# Patient Record
Sex: Female | Born: 1960 | Race: White | Hispanic: No | Marital: Married | Smoking: Former smoker
Health system: Southern US, Community
[De-identification: ages and names within clinical notes are randomized; demographics above are authoritative.]

## PROBLEM LIST (undated history)

## (undated) DIAGNOSIS — E7800 Pure hypercholesterolemia, unspecified: Secondary | ICD-10-CM

## (undated) DIAGNOSIS — C50919 Malignant neoplasm of unspecified site of unspecified female breast: Secondary | ICD-10-CM

## (undated) DIAGNOSIS — C801 Malignant (primary) neoplasm, unspecified: Secondary | ICD-10-CM

## (undated) DIAGNOSIS — F411 Generalized anxiety disorder: Secondary | ICD-10-CM

## (undated) DIAGNOSIS — E782 Mixed hyperlipidemia: Secondary | ICD-10-CM

## (undated) DIAGNOSIS — H4921 Sixth [abducent] nerve palsy, right eye: Secondary | ICD-10-CM

## (undated) DIAGNOSIS — R87613 High grade squamous intraepithelial lesion on cytologic smear of cervix (HGSIL): Secondary | ICD-10-CM

## (undated) DIAGNOSIS — Z720 Tobacco use: Secondary | ICD-10-CM

## (undated) DIAGNOSIS — T8859XA Other complications of anesthesia, initial encounter: Secondary | ICD-10-CM

## (undated) HISTORY — PX: WISDOM TOOTH EXTRACTION: SHX21

## (undated) HISTORY — DX: Mixed hyperlipidemia: E78.2

## (undated) HISTORY — DX: Hypercalcemia: E83.52

## (undated) HISTORY — DX: Tobacco use: Z72.0

## (undated) HISTORY — PX: OTHER SURGICAL HISTORY: SHX169

## (undated) HISTORY — PX: TONSILLECTOMY: SUR1361

## (undated) HISTORY — PX: BREAST SURGERY: SHX581

## (undated) HISTORY — DX: Sixth (abducent) nerve palsy, right eye: H49.21

## (undated) HISTORY — DX: Generalized anxiety disorder: F41.1

---

## 2012-10-05 HISTORY — PX: BREAST LUMPECTOMY: SHX2

## 2016-07-20 DIAGNOSIS — M545 Low back pain: Secondary | ICD-10-CM | POA: Diagnosis not present

## 2016-07-20 DIAGNOSIS — J018 Other acute sinusitis: Secondary | ICD-10-CM | POA: Diagnosis not present

## 2016-08-20 DIAGNOSIS — H35363 Drusen (degenerative) of macula, bilateral: Secondary | ICD-10-CM | POA: Diagnosis not present

## 2016-10-05 HISTORY — PX: BREAST SURGERY: SHX581

## 2016-10-05 HISTORY — PX: BREAST LUMPECTOMY: SHX2

## 2016-11-03 DIAGNOSIS — J0100 Acute maxillary sinusitis, unspecified: Secondary | ICD-10-CM | POA: Diagnosis not present

## 2016-11-03 DIAGNOSIS — H6122 Impacted cerumen, left ear: Secondary | ICD-10-CM | POA: Diagnosis not present

## 2016-11-10 DIAGNOSIS — H6122 Impacted cerumen, left ear: Secondary | ICD-10-CM | POA: Diagnosis not present

## 2016-11-30 DIAGNOSIS — E782 Mixed hyperlipidemia: Secondary | ICD-10-CM | POA: Diagnosis not present

## 2016-11-30 DIAGNOSIS — Z0000 Encounter for general adult medical examination without abnormal findings: Secondary | ICD-10-CM | POA: Diagnosis not present

## 2016-12-10 DIAGNOSIS — Z6834 Body mass index (BMI) 34.0-34.9, adult: Secondary | ICD-10-CM | POA: Diagnosis not present

## 2016-12-10 DIAGNOSIS — Z0000 Encounter for general adult medical examination without abnormal findings: Secondary | ICD-10-CM | POA: Diagnosis not present

## 2016-12-16 DIAGNOSIS — R928 Other abnormal and inconclusive findings on diagnostic imaging of breast: Secondary | ICD-10-CM | POA: Diagnosis not present

## 2016-12-16 DIAGNOSIS — N6342 Unspecified lump in left breast, subareolar: Secondary | ICD-10-CM | POA: Diagnosis not present

## 2016-12-16 DIAGNOSIS — N6489 Other specified disorders of breast: Secondary | ICD-10-CM | POA: Diagnosis not present

## 2016-12-16 DIAGNOSIS — N6001 Solitary cyst of right breast: Secondary | ICD-10-CM | POA: Diagnosis not present

## 2016-12-17 DIAGNOSIS — N63 Unspecified lump in unspecified breast: Secondary | ICD-10-CM | POA: Diagnosis not present

## 2016-12-17 DIAGNOSIS — N6320 Unspecified lump in the left breast, unspecified quadrant: Secondary | ICD-10-CM | POA: Diagnosis not present

## 2016-12-17 DIAGNOSIS — C50812 Malignant neoplasm of overlapping sites of left female breast: Secondary | ICD-10-CM | POA: Diagnosis not present

## 2016-12-24 ENCOUNTER — Other Ambulatory Visit: Payer: Self-pay | Admitting: Medical

## 2016-12-24 DIAGNOSIS — C50912 Malignant neoplasm of unspecified site of left female breast: Secondary | ICD-10-CM

## 2016-12-28 ENCOUNTER — Ambulatory Visit
Admission: RE | Admit: 2016-12-28 | Discharge: 2016-12-28 | Disposition: A | Discharge status: Home / Self Care | Payer: BLUE CROSS/BLUE SHIELD | Source: Ambulatory Visit | Attending: Medical | Admitting: Medical

## 2016-12-28 DIAGNOSIS — C50912 Malignant neoplasm of unspecified site of left female breast: Secondary | ICD-10-CM

## 2016-12-28 MED ORDER — GADOBENATE DIMEGLUMINE 529 MG/ML IV SOLN
17.0000 mL | Freq: Once | INTRAVENOUS | Status: AC | PRN
  Administered 2016-12-28: 17 mL via INTRAVENOUS

## 2017-01-07 DIAGNOSIS — C50912 Malignant neoplasm of unspecified site of left female breast: Secondary | ICD-10-CM | POA: Diagnosis not present

## 2017-01-07 DIAGNOSIS — Z170 Estrogen receptor positive status [ER+]: Secondary | ICD-10-CM | POA: Diagnosis not present

## 2017-01-25 DIAGNOSIS — Z170 Estrogen receptor positive status [ER+]: Secondary | ICD-10-CM | POA: Diagnosis not present

## 2017-01-25 DIAGNOSIS — C50412 Malignant neoplasm of upper-outer quadrant of left female breast: Secondary | ICD-10-CM | POA: Diagnosis not present

## 2017-01-25 DIAGNOSIS — Z6833 Body mass index (BMI) 33.0-33.9, adult: Secondary | ICD-10-CM | POA: Diagnosis not present

## 2017-01-25 DIAGNOSIS — C50912 Malignant neoplasm of unspecified site of left female breast: Secondary | ICD-10-CM | POA: Diagnosis not present

## 2017-01-25 DIAGNOSIS — F17210 Nicotine dependence, cigarettes, uncomplicated: Secondary | ICD-10-CM | POA: Diagnosis not present

## 2017-01-25 DIAGNOSIS — E7800 Pure hypercholesterolemia, unspecified: Secondary | ICD-10-CM | POA: Diagnosis not present

## 2017-01-25 DIAGNOSIS — E669 Obesity, unspecified: Secondary | ICD-10-CM | POA: Diagnosis not present

## 2017-01-25 DIAGNOSIS — Z79899 Other long term (current) drug therapy: Secondary | ICD-10-CM | POA: Diagnosis not present

## 2017-02-04 DIAGNOSIS — C50912 Malignant neoplasm of unspecified site of left female breast: Secondary | ICD-10-CM | POA: Diagnosis not present

## 2017-02-16 DIAGNOSIS — Z9889 Other specified postprocedural states: Secondary | ICD-10-CM | POA: Diagnosis not present

## 2017-02-24 DIAGNOSIS — C50812 Malignant neoplasm of overlapping sites of left female breast: Secondary | ICD-10-CM | POA: Diagnosis not present

## 2017-02-24 DIAGNOSIS — Z716 Tobacco abuse counseling: Secondary | ICD-10-CM | POA: Diagnosis not present

## 2017-02-24 DIAGNOSIS — Z170 Estrogen receptor positive status [ER+]: Secondary | ICD-10-CM | POA: Diagnosis not present

## 2017-03-05 DIAGNOSIS — Z853 Personal history of malignant neoplasm of breast: Secondary | ICD-10-CM | POA: Diagnosis not present

## 2017-03-05 DIAGNOSIS — Z13820 Encounter for screening for osteoporosis: Secondary | ICD-10-CM | POA: Diagnosis not present

## 2017-03-05 DIAGNOSIS — M8589 Other specified disorders of bone density and structure, multiple sites: Secondary | ICD-10-CM | POA: Diagnosis not present

## 2017-03-11 DIAGNOSIS — C50112 Malignant neoplasm of central portion of left female breast: Secondary | ICD-10-CM | POA: Diagnosis not present

## 2017-03-11 DIAGNOSIS — C50912 Malignant neoplasm of unspecified site of left female breast: Secondary | ICD-10-CM | POA: Diagnosis not present

## 2017-03-17 DIAGNOSIS — Z5111 Encounter for antineoplastic chemotherapy: Secondary | ICD-10-CM | POA: Diagnosis not present

## 2017-03-17 DIAGNOSIS — C50112 Malignant neoplasm of central portion of left female breast: Secondary | ICD-10-CM | POA: Diagnosis not present

## 2017-03-24 DIAGNOSIS — Z510 Encounter for antineoplastic radiation therapy: Secondary | ICD-10-CM | POA: Diagnosis not present

## 2017-03-24 DIAGNOSIS — C50112 Malignant neoplasm of central portion of left female breast: Secondary | ICD-10-CM | POA: Diagnosis not present

## 2017-03-26 DIAGNOSIS — Z510 Encounter for antineoplastic radiation therapy: Secondary | ICD-10-CM | POA: Diagnosis not present

## 2017-03-26 DIAGNOSIS — C50112 Malignant neoplasm of central portion of left female breast: Secondary | ICD-10-CM | POA: Diagnosis not present

## 2017-03-29 DIAGNOSIS — C50112 Malignant neoplasm of central portion of left female breast: Secondary | ICD-10-CM | POA: Diagnosis not present

## 2017-03-29 DIAGNOSIS — Z510 Encounter for antineoplastic radiation therapy: Secondary | ICD-10-CM | POA: Diagnosis not present

## 2017-03-30 DIAGNOSIS — C50112 Malignant neoplasm of central portion of left female breast: Secondary | ICD-10-CM | POA: Diagnosis not present

## 2017-03-30 DIAGNOSIS — Z510 Encounter for antineoplastic radiation therapy: Secondary | ICD-10-CM | POA: Diagnosis not present

## 2017-03-31 DIAGNOSIS — Z510 Encounter for antineoplastic radiation therapy: Secondary | ICD-10-CM | POA: Diagnosis not present

## 2017-03-31 DIAGNOSIS — C50112 Malignant neoplasm of central portion of left female breast: Secondary | ICD-10-CM | POA: Diagnosis not present

## 2017-04-01 DIAGNOSIS — C50112 Malignant neoplasm of central portion of left female breast: Secondary | ICD-10-CM | POA: Diagnosis not present

## 2017-04-01 DIAGNOSIS — Z510 Encounter for antineoplastic radiation therapy: Secondary | ICD-10-CM | POA: Diagnosis not present

## 2017-04-02 DIAGNOSIS — Z510 Encounter for antineoplastic radiation therapy: Secondary | ICD-10-CM | POA: Diagnosis not present

## 2017-04-02 DIAGNOSIS — C50112 Malignant neoplasm of central portion of left female breast: Secondary | ICD-10-CM | POA: Diagnosis not present

## 2017-04-05 DIAGNOSIS — Z510 Encounter for antineoplastic radiation therapy: Secondary | ICD-10-CM | POA: Diagnosis not present

## 2017-04-05 DIAGNOSIS — C50112 Malignant neoplasm of central portion of left female breast: Secondary | ICD-10-CM | POA: Diagnosis not present

## 2017-04-06 DIAGNOSIS — Z510 Encounter for antineoplastic radiation therapy: Secondary | ICD-10-CM | POA: Diagnosis not present

## 2017-04-06 DIAGNOSIS — C50112 Malignant neoplasm of central portion of left female breast: Secondary | ICD-10-CM | POA: Diagnosis not present

## 2017-04-08 DIAGNOSIS — Z510 Encounter for antineoplastic radiation therapy: Secondary | ICD-10-CM | POA: Diagnosis not present

## 2017-04-08 DIAGNOSIS — C50112 Malignant neoplasm of central portion of left female breast: Secondary | ICD-10-CM | POA: Diagnosis not present

## 2017-04-09 DIAGNOSIS — Z510 Encounter for antineoplastic radiation therapy: Secondary | ICD-10-CM | POA: Diagnosis not present

## 2017-04-09 DIAGNOSIS — C50112 Malignant neoplasm of central portion of left female breast: Secondary | ICD-10-CM | POA: Diagnosis not present

## 2017-04-12 DIAGNOSIS — C50112 Malignant neoplasm of central portion of left female breast: Secondary | ICD-10-CM | POA: Diagnosis not present

## 2017-04-12 DIAGNOSIS — Z510 Encounter for antineoplastic radiation therapy: Secondary | ICD-10-CM | POA: Diagnosis not present

## 2017-04-13 DIAGNOSIS — C50112 Malignant neoplasm of central portion of left female breast: Secondary | ICD-10-CM | POA: Diagnosis not present

## 2017-04-13 DIAGNOSIS — Z510 Encounter for antineoplastic radiation therapy: Secondary | ICD-10-CM | POA: Diagnosis not present

## 2017-04-14 DIAGNOSIS — Z510 Encounter for antineoplastic radiation therapy: Secondary | ICD-10-CM | POA: Diagnosis not present

## 2017-04-14 DIAGNOSIS — C50112 Malignant neoplasm of central portion of left female breast: Secondary | ICD-10-CM | POA: Diagnosis not present

## 2017-04-15 DIAGNOSIS — C50112 Malignant neoplasm of central portion of left female breast: Secondary | ICD-10-CM | POA: Diagnosis not present

## 2017-04-15 DIAGNOSIS — Z510 Encounter for antineoplastic radiation therapy: Secondary | ICD-10-CM | POA: Diagnosis not present

## 2017-04-16 DIAGNOSIS — C50112 Malignant neoplasm of central portion of left female breast: Secondary | ICD-10-CM | POA: Diagnosis not present

## 2017-04-16 DIAGNOSIS — Z510 Encounter for antineoplastic radiation therapy: Secondary | ICD-10-CM | POA: Diagnosis not present

## 2017-04-19 DIAGNOSIS — C50112 Malignant neoplasm of central portion of left female breast: Secondary | ICD-10-CM | POA: Diagnosis not present

## 2017-04-19 DIAGNOSIS — Z510 Encounter for antineoplastic radiation therapy: Secondary | ICD-10-CM | POA: Diagnosis not present

## 2017-04-20 DIAGNOSIS — Z510 Encounter for antineoplastic radiation therapy: Secondary | ICD-10-CM | POA: Diagnosis not present

## 2017-04-20 DIAGNOSIS — C50112 Malignant neoplasm of central portion of left female breast: Secondary | ICD-10-CM | POA: Diagnosis not present

## 2017-04-21 DIAGNOSIS — Z510 Encounter for antineoplastic radiation therapy: Secondary | ICD-10-CM | POA: Diagnosis not present

## 2017-04-21 DIAGNOSIS — C50112 Malignant neoplasm of central portion of left female breast: Secondary | ICD-10-CM | POA: Diagnosis not present

## 2017-04-22 DIAGNOSIS — Z510 Encounter for antineoplastic radiation therapy: Secondary | ICD-10-CM | POA: Diagnosis not present

## 2017-04-22 DIAGNOSIS — C50112 Malignant neoplasm of central portion of left female breast: Secondary | ICD-10-CM | POA: Diagnosis not present

## 2017-04-23 DIAGNOSIS — Z510 Encounter for antineoplastic radiation therapy: Secondary | ICD-10-CM | POA: Diagnosis not present

## 2017-04-23 DIAGNOSIS — C50112 Malignant neoplasm of central portion of left female breast: Secondary | ICD-10-CM | POA: Diagnosis not present

## 2017-04-26 DIAGNOSIS — C50112 Malignant neoplasm of central portion of left female breast: Secondary | ICD-10-CM | POA: Diagnosis not present

## 2017-04-26 DIAGNOSIS — Z510 Encounter for antineoplastic radiation therapy: Secondary | ICD-10-CM | POA: Diagnosis not present

## 2017-04-27 DIAGNOSIS — Z510 Encounter for antineoplastic radiation therapy: Secondary | ICD-10-CM | POA: Diagnosis not present

## 2017-04-27 DIAGNOSIS — C50112 Malignant neoplasm of central portion of left female breast: Secondary | ICD-10-CM | POA: Diagnosis not present

## 2017-04-28 DIAGNOSIS — C50112 Malignant neoplasm of central portion of left female breast: Secondary | ICD-10-CM | POA: Diagnosis not present

## 2017-04-28 DIAGNOSIS — Z510 Encounter for antineoplastic radiation therapy: Secondary | ICD-10-CM | POA: Diagnosis not present

## 2017-04-29 DIAGNOSIS — C50112 Malignant neoplasm of central portion of left female breast: Secondary | ICD-10-CM | POA: Diagnosis not present

## 2017-04-29 DIAGNOSIS — Z510 Encounter for antineoplastic radiation therapy: Secondary | ICD-10-CM | POA: Diagnosis not present

## 2017-04-30 DIAGNOSIS — C50112 Malignant neoplasm of central portion of left female breast: Secondary | ICD-10-CM | POA: Diagnosis not present

## 2017-04-30 DIAGNOSIS — Z510 Encounter for antineoplastic radiation therapy: Secondary | ICD-10-CM | POA: Diagnosis not present

## 2017-05-03 DIAGNOSIS — C50112 Malignant neoplasm of central portion of left female breast: Secondary | ICD-10-CM | POA: Diagnosis not present

## 2017-05-03 DIAGNOSIS — Z510 Encounter for antineoplastic radiation therapy: Secondary | ICD-10-CM | POA: Diagnosis not present

## 2017-05-04 DIAGNOSIS — Z510 Encounter for antineoplastic radiation therapy: Secondary | ICD-10-CM | POA: Diagnosis not present

## 2017-05-04 DIAGNOSIS — C50112 Malignant neoplasm of central portion of left female breast: Secondary | ICD-10-CM | POA: Diagnosis not present

## 2017-05-05 DIAGNOSIS — C50112 Malignant neoplasm of central portion of left female breast: Secondary | ICD-10-CM | POA: Diagnosis not present

## 2017-05-05 DIAGNOSIS — Z510 Encounter for antineoplastic radiation therapy: Secondary | ICD-10-CM | POA: Diagnosis not present

## 2017-05-06 DIAGNOSIS — Z510 Encounter for antineoplastic radiation therapy: Secondary | ICD-10-CM | POA: Diagnosis not present

## 2017-05-06 DIAGNOSIS — C50112 Malignant neoplasm of central portion of left female breast: Secondary | ICD-10-CM | POA: Diagnosis not present

## 2017-05-07 DIAGNOSIS — C50112 Malignant neoplasm of central portion of left female breast: Secondary | ICD-10-CM | POA: Diagnosis not present

## 2017-05-07 DIAGNOSIS — Z510 Encounter for antineoplastic radiation therapy: Secondary | ICD-10-CM | POA: Diagnosis not present

## 2017-05-10 DIAGNOSIS — C50112 Malignant neoplasm of central portion of left female breast: Secondary | ICD-10-CM | POA: Diagnosis not present

## 2017-05-10 DIAGNOSIS — Z510 Encounter for antineoplastic radiation therapy: Secondary | ICD-10-CM | POA: Diagnosis not present

## 2017-06-02 DIAGNOSIS — E782 Mixed hyperlipidemia: Secondary | ICD-10-CM | POA: Diagnosis not present

## 2017-06-04 DIAGNOSIS — Z923 Personal history of irradiation: Secondary | ICD-10-CM | POA: Diagnosis not present

## 2017-06-04 DIAGNOSIS — Z79811 Long term (current) use of aromatase inhibitors: Secondary | ICD-10-CM | POA: Diagnosis not present

## 2017-06-04 DIAGNOSIS — C50112 Malignant neoplasm of central portion of left female breast: Secondary | ICD-10-CM | POA: Diagnosis not present

## 2017-06-04 DIAGNOSIS — C50912 Malignant neoplasm of unspecified site of left female breast: Secondary | ICD-10-CM | POA: Diagnosis not present

## 2017-06-10 DIAGNOSIS — C50112 Malignant neoplasm of central portion of left female breast: Secondary | ICD-10-CM | POA: Diagnosis not present

## 2017-09-03 DIAGNOSIS — Z79811 Long term (current) use of aromatase inhibitors: Secondary | ICD-10-CM | POA: Diagnosis not present

## 2017-09-03 DIAGNOSIS — Z853 Personal history of malignant neoplasm of breast: Secondary | ICD-10-CM | POA: Diagnosis not present

## 2017-10-05 DIAGNOSIS — H4921 Sixth [abducent] nerve palsy, right eye: Secondary | ICD-10-CM

## 2017-10-05 DIAGNOSIS — I639 Cerebral infarction, unspecified: Secondary | ICD-10-CM

## 2017-10-05 HISTORY — DX: Sixth (abducent) nerve palsy, right eye: H49.21

## 2017-10-05 HISTORY — DX: Cerebral infarction, unspecified: I63.9

## 2017-11-05 HISTORY — PX: CERVICAL CONE BIOPSY: SUR198

## 2017-12-03 DIAGNOSIS — Z86000 Personal history of in-situ neoplasm of breast: Secondary | ICD-10-CM | POA: Diagnosis not present

## 2017-12-03 DIAGNOSIS — E782 Mixed hyperlipidemia: Secondary | ICD-10-CM | POA: Diagnosis not present

## 2017-12-20 DIAGNOSIS — R922 Inconclusive mammogram: Secondary | ICD-10-CM | POA: Diagnosis not present

## 2017-12-20 DIAGNOSIS — C50112 Malignant neoplasm of central portion of left female breast: Secondary | ICD-10-CM | POA: Diagnosis not present

## 2017-12-20 DIAGNOSIS — N6489 Other specified disorders of breast: Secondary | ICD-10-CM | POA: Diagnosis not present

## 2017-12-23 DIAGNOSIS — C50912 Malignant neoplasm of unspecified site of left female breast: Secondary | ICD-10-CM | POA: Diagnosis not present

## 2017-12-23 DIAGNOSIS — Z853 Personal history of malignant neoplasm of breast: Secondary | ICD-10-CM | POA: Diagnosis not present

## 2017-12-23 DIAGNOSIS — Z923 Personal history of irradiation: Secondary | ICD-10-CM | POA: Diagnosis not present

## 2017-12-23 DIAGNOSIS — Z79811 Long term (current) use of aromatase inhibitors: Secondary | ICD-10-CM | POA: Diagnosis not present

## 2018-03-24 DIAGNOSIS — Z853 Personal history of malignant neoplasm of breast: Secondary | ICD-10-CM | POA: Diagnosis not present

## 2018-03-24 DIAGNOSIS — F17210 Nicotine dependence, cigarettes, uncomplicated: Secondary | ICD-10-CM | POA: Diagnosis not present

## 2018-03-29 DIAGNOSIS — R918 Other nonspecific abnormal finding of lung field: Secondary | ICD-10-CM | POA: Diagnosis not present

## 2018-03-29 DIAGNOSIS — I700 Atherosclerosis of aorta: Secondary | ICD-10-CM | POA: Diagnosis not present

## 2018-03-29 DIAGNOSIS — F17210 Nicotine dependence, cigarettes, uncomplicated: Secondary | ICD-10-CM | POA: Diagnosis not present

## 2018-03-29 DIAGNOSIS — Z87891 Personal history of nicotine dependence: Secondary | ICD-10-CM | POA: Diagnosis not present

## 2018-06-07 DIAGNOSIS — E782 Mixed hyperlipidemia: Secondary | ICD-10-CM | POA: Diagnosis not present

## 2018-06-07 DIAGNOSIS — Z86000 Personal history of in-situ neoplasm of breast: Secondary | ICD-10-CM | POA: Diagnosis not present

## 2018-06-24 DIAGNOSIS — L538 Other specified erythematous conditions: Secondary | ICD-10-CM

## 2018-06-24 DIAGNOSIS — Z923 Personal history of irradiation: Secondary | ICD-10-CM

## 2018-06-24 DIAGNOSIS — Z853 Personal history of malignant neoplasm of breast: Secondary | ICD-10-CM | POA: Diagnosis not present

## 2018-06-24 DIAGNOSIS — C50912 Malignant neoplasm of unspecified site of left female breast: Secondary | ICD-10-CM

## 2018-06-24 DIAGNOSIS — Z79811 Long term (current) use of aromatase inhibitors: Secondary | ICD-10-CM

## 2018-09-21 DIAGNOSIS — Z853 Personal history of malignant neoplasm of breast: Secondary | ICD-10-CM | POA: Diagnosis not present

## 2018-09-21 DIAGNOSIS — Z79811 Long term (current) use of aromatase inhibitors: Secondary | ICD-10-CM | POA: Diagnosis not present

## 2018-09-27 DIAGNOSIS — G459 Transient cerebral ischemic attack, unspecified: Secondary | ICD-10-CM | POA: Insufficient documentation

## 2018-09-29 ENCOUNTER — Emergency Department (HOSPITAL_COMMUNITY): Payer: BLUE CROSS/BLUE SHIELD

## 2018-09-29 ENCOUNTER — Emergency Department (HOSPITAL_COMMUNITY)
Admission: EM | Admit: 2018-09-29 | Discharge: 2018-09-29 | Disposition: A | Discharge status: Home / Self Care | Payer: BLUE CROSS/BLUE SHIELD | Attending: Emergency Medicine | Admitting: Emergency Medicine

## 2018-09-29 ENCOUNTER — Other Ambulatory Visit: Payer: Self-pay

## 2018-09-29 ENCOUNTER — Encounter (HOSPITAL_COMMUNITY): Payer: Self-pay | Admitting: *Deleted

## 2018-09-29 DIAGNOSIS — Z79899 Other long term (current) drug therapy: Secondary | ICD-10-CM | POA: Diagnosis not present

## 2018-09-29 DIAGNOSIS — R9082 White matter disease, unspecified: Secondary | ICD-10-CM | POA: Diagnosis not present

## 2018-09-29 DIAGNOSIS — H532 Diplopia: Secondary | ICD-10-CM | POA: Diagnosis not present

## 2018-09-29 DIAGNOSIS — H53481 Generalized contraction of visual field, right eye: Secondary | ICD-10-CM | POA: Diagnosis not present

## 2018-09-29 DIAGNOSIS — R51 Headache: Secondary | ICD-10-CM | POA: Diagnosis not present

## 2018-09-29 DIAGNOSIS — H4921 Sixth [abducent] nerve palsy, right eye: Secondary | ICD-10-CM | POA: Insufficient documentation

## 2018-09-29 DIAGNOSIS — R001 Bradycardia, unspecified: Secondary | ICD-10-CM | POA: Diagnosis not present

## 2018-09-29 DIAGNOSIS — F17200 Nicotine dependence, unspecified, uncomplicated: Secondary | ICD-10-CM | POA: Diagnosis not present

## 2018-09-29 HISTORY — DX: Malignant (primary) neoplasm, unspecified: C80.1

## 2018-09-29 HISTORY — DX: Pure hypercholesterolemia, unspecified: E78.00

## 2018-09-29 LAB — CBG MONITORING, ED: Glucose-Capillary: 76 mg/dL (ref 70–99)

## 2018-09-29 LAB — APTT: aPTT: 29 seconds (ref 24–36)

## 2018-09-29 LAB — COMPREHENSIVE METABOLIC PANEL
ALK PHOS: 92 U/L (ref 38–126)
ALT: 22 U/L (ref 0–44)
ANION GAP: 8 (ref 5–15)
AST: 23 U/L (ref 15–41)
Albumin: 4.2 g/dL (ref 3.5–5.0)
BUN: 15 mg/dL (ref 6–20)
CALCIUM: 10.3 mg/dL (ref 8.9–10.3)
CO2: 23 mmol/L (ref 22–32)
Chloride: 108 mmol/L (ref 98–111)
Creatinine, Ser: 1.02 mg/dL — ABNORMAL HIGH (ref 0.44–1.00)
GFR calc Af Amer: >60 mL/min (ref >60)
GFR calc non Af Amer: >60 mL/min (ref >60)
Glucose, Bld: 92 mg/dL (ref 70–99)
POTASSIUM: 4.3 mmol/L (ref 3.5–5.1)
Sodium: 139 mmol/L (ref 135–145)
TOTAL PROTEIN: 6.9 g/dL (ref 6.5–8.1)
Total Bilirubin: 0.6 mg/dL (ref 0.3–1.2)

## 2018-09-29 LAB — I-STAT CHEM 8, ED
BUN: 17 mg/dL (ref 6–20)
Calcium, Ion: 1.32 mmol/L (ref 1.15–1.40)
Chloride: 108 mmol/L (ref 98–111)
Creatinine, Ser: 1 mg/dL (ref 0.44–1.00)
Glucose, Bld: 88 mg/dL (ref 70–99)
HCT: 45 % (ref 36.0–46.0)
Hemoglobin: 15.3 g/dL — ABNORMAL HIGH (ref 12.0–15.0)
Potassium: 3.9 mmol/L (ref 3.5–5.1)
Sodium: 141 mmol/L (ref 135–145)
TCO2: 27 mmol/L (ref 22–32)

## 2018-09-29 LAB — I-STAT TROPONIN, ED: Troponin i, poc: 0 ng/mL (ref 0.00–0.08)

## 2018-09-29 LAB — DIFFERENTIAL
Abs Immature Granulocytes: 0.01 10*3/uL (ref 0.00–0.07)
Basophils Absolute: 0.1 10*3/uL (ref 0.0–0.1)
Basophils Relative: 1 %
Eosinophils Absolute: 0.1 10*3/uL (ref 0.0–0.5)
Eosinophils Relative: 1 %
Immature Granulocytes: 0 %
Lymphocytes Relative: 26 %
Lymphs Abs: 2.4 10*3/uL (ref 0.7–4.0)
Monocytes Absolute: 0.5 10*3/uL (ref 0.1–1.0)
Monocytes Relative: 6 %
Neutro Abs: 6.2 10*3/uL (ref 1.7–7.7)
Neutrophils Relative %: 66 %

## 2018-09-29 LAB — PROTIME-INR
INR: 1.02
Prothrombin Time: 13.3 seconds (ref 11.4–15.2)

## 2018-09-29 LAB — CBC
HCT: 44.3 % (ref 36.0–46.0)
Hemoglobin: 14.7 g/dL (ref 12.0–15.0)
MCH: 32.1 pg (ref 26.0–34.0)
MCHC: 33.2 g/dL (ref 30.0–36.0)
MCV: 96.7 fL (ref 80.0–100.0)
NRBC: 0 % (ref 0.0–0.2)
Platelets: 259 10*3/uL (ref 150–400)
RBC: 4.58 MIL/uL (ref 3.87–5.11)
RDW: 11.7 % (ref 11.5–15.5)
WBC: 9.3 10*3/uL (ref 4.0–10.5)

## 2018-09-29 LAB — I-STAT BETA HCG BLOOD, ED (MC, WL, AP ONLY): I-stat hCG, quantitative: 5.2 m[IU]/mL — ABNORMAL HIGH (ref <5)

## 2018-09-29 MED ORDER — LORAZEPAM 2 MG/ML IJ SOLN
1.0000 mg | Freq: Once | INTRAMUSCULAR | Status: AC
  Administered 2018-09-29: 1 mg via INTRAVENOUS
  Filled 2018-09-29: qty 1

## 2018-09-29 MED ORDER — GADOBUTROL 1 MMOL/ML IV SOLN
6.5000 mL | Freq: Once | INTRAVENOUS | Status: AC | PRN
  Administered 2018-09-29: 6.5 mL via INTRAVENOUS

## 2018-09-29 NOTE — ED Notes (Signed)
Patient back from MRI and has steady gait ambulating to restroom.

## 2018-09-29 NOTE — ED Provider Notes (Signed)
[facility] EMERGENCY DEPARTMENT Provider Note   CSN: [id number] Arrival date & time: 09/29/18  1339     History   Chief Complaint Chief Complaint  Patient presents with  . Eye Problem    HPI Victoria Parsons is a 57 y.o. female.  The history is provided by the patient and medical records. No language interpreter was used.  Eye Problem     Victoria Parsons is a 57 y.o. female who presents to the Emergency Department complaining of double vision. She presents to the emergency department for evaluation of double vision that began on Tuesday of this week. She saw her ophthalmologist today, who referred her to the emergency department for concern for possible stroke. She has had difficulty with double vision, worse when looking at objects far away. She denies any additional numbness, weakness, gait difficulties. No prior similar symptoms. Past Medical History:  Diagnosis Date  . Cancer [facility])    breast cancer with lumpectomy and radiation  . Hypercholesterolemia     There are no active problems to display for this patient.   Past Surgical History:  Procedure Laterality Date  . TONSILLECTOMY       OB History   No obstetric history on file.      Home Medications    Prior to Admission medications   Medication Sig Start Date End Date Taking? Authorizing Provider  acetaminophen (TYLENOL) 500 MG tablet Take 1,500 mg by mouth every 6 (six) hours as needed for headache.   Yes [provider]  anastrozole (ARIMIDEX) 1 MG tablet Take 1 mg by mouth at bedtime.    Yes [provider]  atorvastatin (LIPITOR) 10 MG tablet Take 10 mg by mouth at bedtime.    Yes [provider]  calcium-vitamin D (OSCAL WITH D) 500-200 MG-UNIT tablet Take 2 tablets by mouth at bedtime.   Yes [provider]  loratadine (CLARITIN) 10 MG tablet Take 10 mg by mouth at bedtime.    Yes [provider]  Omega-3 300 MG CAPS Take 1 capsule by mouth at  bedtime.   Yes [provider]    Family History No family history on file.  Social History Social History   Tobacco Use  . Smoking status: Current Every Day Smoker  . Smokeless tobacco: Never Used  Substance Use Topics  . Alcohol use: Not Currently  . Drug use: Never     Allergies   Penicillins   Review of Systems Review of Systems  All other systems reviewed and are negative.    Physical Exam Updated Vital Signs BP (!) 139/57 (BP Location: Right Arm)   Pulse 60   Temp 98 F (36.7 C) (Oral)   Resp 17   SpO2 98%   Physical Exam Vitals signs and nursing note reviewed.  Constitutional:      Appearance: She is well-developed.  HENT:     Head: Normocephalic and atraumatic.  Eyes:     Pupils: Pupils are equal, round, and reactive to light.  Cardiovascular:     Rate and Rhythm: Normal rate and regular rhythm.     Heart sounds: No murmur.  Pulmonary:     Effort: Pulmonary effort is normal. No respiratory distress.     Breath sounds: Normal breath sounds.  Abdominal:     Palpations: Abdomen is soft.     Tenderness: There is no abdominal tenderness. There is no guarding or rebound.  Musculoskeletal:        General: No tenderness.  Skin:    General: Skin is warm and dry.     Capillary Refill: Capillary refill takes less than 2 seconds.  Neurological:     Mental Status: She is alert and oriented to person, place, and time.     Comments: Right CN VI palsy, visual fields grossly intact, 5/5 strength in all four extremities with sensation to light touch intact in all four extremities.  No pronator drift.  No ataxia on FTN bilaterally  Psychiatric:        Behavior: Behavior normal.      ED Treatments / Results  Labs (all labs ordered are listed, but only abnormal results are displayed) Labs Reviewed  COMPREHENSIVE METABOLIC PANEL - Abnormal; Notable for the following components:      Result Value   Creatinine, Ser 1.02 (*)    All other components  within normal limits  I-STAT CHEM 8, ED - Abnormal; Notable for the following components:   Hemoglobin 15.3 (*)    All other components within normal limits  I-STAT BETA HCG BLOOD, ED ([facility], [facility], [facility] ONLY) - Abnormal; Notable for the following components:   I-stat hCG, quantitative 5.2 (*)    All other components within normal limits  PROTIME-INR  APTT  CBC  DIFFERENTIAL  I-STAT TROPONIN, ED  CBG MONITORING, ED    EKG EKG Interpretation  Date/Time:  Thursday September 29 2018 14:12:11 EST Ventricular Rate:  57 PR Interval:  150 QRS Duration: 90 QT Interval:  390 QTC Calculation: 379 R Axis:   65 Text Interpretation:  Sinus bradycardia Otherwise normal ECG Confirmed by Quintella Reichert [phone]) on 09/29/2018 3:01:47 PM   Radiology Ct Head Wo Contrast  Result Date: 09/29/2018 CLINICAL DATA:  Headache and double vision for 2 days. EXAM: CT HEAD WITHOUT CONTRAST TECHNIQUE: Contiguous axial images were obtained from the base of the skull through the vertex without intravenous contrast. COMPARISON:  None. FINDINGS: BRAIN: The ventricles and sulci are normal. No intraparenchymal hemorrhage, mass effect nor midline shift. No acute large vascular territory infarcts. Grey-white matter distinction is maintained. The basal ganglia are unremarkable. No abnormal extra-axial fluid collections. Basal cisterns are not effaced and midline. The brainstem and cerebellar hemispheres are without acute abnormalities. No suprasellar abnormality. VASCULAR: No hyperdense vessel sign. SKULL/SOFT TISSUES: No skull fracture. No significant soft tissue swelling. ORBITS/SINUSES: The included ocular globes and orbital contents are normal.The mastoid air cells are clear. The included paranasal sinuses are well-aerated. OTHER: None. IMPRESSION: Normal head CT. Electronically Signed   By: Ashley Royalty M.D.   On: 09/29/2018 14:48   Mr Jeri Cos And Wo Contrast  Result Date: 09/29/2018 CLINICAL DATA:  Initial evaluation for  acute diplopia, headache. EXAM: MRI HEAD WITHOUT AND WITH CONTRAST TECHNIQUE: Multiplanar, multiecho pulse sequences of the brain and surrounding structures were obtained without and with intravenous contrast. CONTRAST:  6.5 cc of Gadavist. COMPARISON:  Prior CT from earlier the same day. FINDINGS: Brain: Cerebral volume within normal limits for age. Scattered patchy T2/FLAIR hyperintensity within the periventricular, deep, and subcortical white matter both cerebral hemispheres, nonspecific. Overall, appearance is mild in nature. No abnormal foci of restricted diffusion to suggest acute or subacute ischemia. Gray-white matter differentiation maintained. No encephalomalacia to suggest chronic infarction. No foci of susceptibility artifact to suggest acute or chronic intracranial hemorrhage. No mass lesion, midline shift or mass effect. No hydrocephalus. No extra-axial fluid collection. Pituitary gland and suprasellar region within normal limits. No abnormal enhancement. Vascular: Major intracranial vascular flow voids maintained. Skull and  upper cervical spine: Craniocervical junction within normal limits. Upper cervical spine normal. Bone marrow signal intensity normal. No scalp soft tissue abnormality. Sinuses/Orbits: Globes and orbital soft tissues demonstrate no acute finding. Paranasal sinuses are largely clear. No mastoid effusion. Inner ear structures normal. Other: None. IMPRESSION: 1. No acute intracranial abnormality. 2. Mild cerebral white matter change, nonspecific, but most commonly related to mild chronic small vessel ischemic disease. Differential considerations include sequelae of complicated migraine, vasculitis, or prior infectious and/or inflammatory process. Changes would not be typical for underlying demyelinating disease. Electronically Signed   By: Jeannine Boga M.D.   On: 09/29/2018 19:16    Procedures Procedures (including critical care time)  Medications Ordered in  ED Medications  LORazepam (ATIVAN) injection 1 mg (1 mg Intravenous Given 09/29/18 1756)  gadobutrol (GADAVIST) 1 MMOL/ML injection 6.5 mL (6.5 mLs Intravenous Contrast Given 09/29/18 1833)     Initial Impression / Assessment and Plan / ED Course  I have reviewed the triage vital signs and the nursing notes.  Pertinent labs & imaging results that were available during my care of the patient were reviewed by me and considered in my medical decision making (see chart for details).     Patient presents to the emergency department for evaluation of double vision, referred by ophthalmology due to concern for possible stroke. On evaluation she has a right cranial nerve VI palsy. Discussed with neurologist, recommends MRI brain with and without contrast.   MRI brain with no findings of acute stroke were MS. Discussed findings of MRI with neurologist on call. Plan to discharge home with outpatient neuro- ophthalmology follow-up as well as return precautions. Final Clinical Impressions(s) / ED Diagnoses   Final diagnoses:  Sixth nerve palsy of right eye    ED Discharge Orders         Ordered    Ambulatory referral to Neurology    Comments:  An appointment is requested in approximately: 1 week   09/29/18 2008           Quintella Reichert, MD 09/29/18 [phone]

## 2018-09-29 NOTE — ED Notes (Signed)
Patient ambulatory to bathroom with steady gait at this time 

## 2018-09-29 NOTE — ED Triage Notes (Signed)
Pt is here with double vision that started on Tuesday.  Eye doctor did some tests and now questions a possible mild stroke.  Had a bad headache on Tuesday and not one yesterday, but reports a mild headache all over today.

## 2018-09-29 NOTE — ED Notes (Signed)
Pt transported to MRI 

## 2018-09-29 NOTE — Discharge Instructions (Addendum)
The cause of your symptoms was not identified today. Please get rechecked immediately if you have any new or concerning symptoms. Please follow-up with neurology and neuro- ophthalmology.

## 2018-10-10 DIAGNOSIS — H532 Diplopia: Secondary | ICD-10-CM | POA: Diagnosis not present

## 2018-10-10 DIAGNOSIS — H4921 Sixth [abducent] nerve palsy, right eye: Secondary | ICD-10-CM | POA: Diagnosis not present

## 2018-10-18 DIAGNOSIS — H4921 Sixth [abducent] nerve palsy, right eye: Secondary | ICD-10-CM | POA: Diagnosis not present

## 2018-10-22 DIAGNOSIS — H532 Diplopia: Secondary | ICD-10-CM | POA: Diagnosis not present

## 2018-10-22 DIAGNOSIS — H4921 Sixth [abducent] nerve palsy, right eye: Secondary | ICD-10-CM | POA: Diagnosis not present

## 2018-10-24 ENCOUNTER — Encounter

## 2018-10-24 ENCOUNTER — Ambulatory Visit: Payer: BLUE CROSS/BLUE SHIELD | Admitting: Neurology

## 2018-12-06 DIAGNOSIS — Z0000 Encounter for general adult medical examination without abnormal findings: Secondary | ICD-10-CM | POA: Diagnosis not present

## 2018-12-06 DIAGNOSIS — Z6825 Body mass index (BMI) 25.0-25.9, adult: Secondary | ICD-10-CM | POA: Diagnosis not present

## 2018-12-06 DIAGNOSIS — Z1231 Encounter for screening mammogram for malignant neoplasm of breast: Secondary | ICD-10-CM | POA: Diagnosis not present

## 2018-12-22 DIAGNOSIS — N6322 Unspecified lump in the left breast, upper inner quadrant: Secondary | ICD-10-CM | POA: Diagnosis not present

## 2018-12-22 DIAGNOSIS — C50112 Malignant neoplasm of central portion of left female breast: Secondary | ICD-10-CM | POA: Diagnosis not present

## 2019-03-31 DIAGNOSIS — C50912 Malignant neoplasm of unspecified site of left female breast: Secondary | ICD-10-CM | POA: Diagnosis not present

## 2019-03-31 DIAGNOSIS — D509 Iron deficiency anemia, unspecified: Secondary | ICD-10-CM | POA: Diagnosis not present

## 2019-03-31 DIAGNOSIS — Z170 Estrogen receptor positive status [ER+]: Secondary | ICD-10-CM | POA: Diagnosis not present

## 2019-04-05 DIAGNOSIS — M8589 Other specified disorders of bone density and structure, multiple sites: Secondary | ICD-10-CM | POA: Diagnosis not present

## 2019-04-05 LAB — HM DEXA SCAN

## 2019-04-19 DIAGNOSIS — M8580 Other specified disorders of bone density and structure, unspecified site: Secondary | ICD-10-CM

## 2019-04-19 HISTORY — DX: Other specified disorders of bone density and structure, unspecified site: M85.80

## 2019-06-08 DIAGNOSIS — Z23 Encounter for immunization: Secondary | ICD-10-CM | POA: Diagnosis not present

## 2019-06-08 DIAGNOSIS — E782 Mixed hyperlipidemia: Secondary | ICD-10-CM | POA: Diagnosis not present

## 2019-08-02 DIAGNOSIS — Z170 Estrogen receptor positive status [ER+]: Secondary | ICD-10-CM | POA: Diagnosis not present

## 2019-08-02 DIAGNOSIS — Z853 Personal history of malignant neoplasm of breast: Secondary | ICD-10-CM | POA: Diagnosis not present

## 2019-08-02 DIAGNOSIS — C50912 Malignant neoplasm of unspecified site of left female breast: Secondary | ICD-10-CM | POA: Diagnosis not present

## 2019-08-02 DIAGNOSIS — Z79811 Long term (current) use of aromatase inhibitors: Secondary | ICD-10-CM | POA: Diagnosis not present

## 2019-11-06 HISTORY — PX: CERVICAL CONE BIOPSY: SUR198

## 2019-12-15 ENCOUNTER — Encounter: Payer: Self-pay | Admitting: Nurse Practitioner

## 2019-12-15 ENCOUNTER — Ambulatory Visit (INDEPENDENT_AMBULATORY_CARE_PROVIDER_SITE_OTHER): Payer: 59 | Admitting: Nurse Practitioner

## 2019-12-15 ENCOUNTER — Other Ambulatory Visit: Payer: Self-pay

## 2019-12-15 VITALS — BP 136/80 | HR 88 | Temp 98.0°F | Ht 62.0 in | Wt 160.2 lb

## 2019-12-15 DIAGNOSIS — Z23 Encounter for immunization: Secondary | ICD-10-CM | POA: Insufficient documentation

## 2019-12-15 DIAGNOSIS — C50912 Malignant neoplasm of unspecified site of left female breast: Secondary | ICD-10-CM | POA: Diagnosis not present

## 2019-12-15 DIAGNOSIS — Z716 Tobacco abuse counseling: Secondary | ICD-10-CM

## 2019-12-15 DIAGNOSIS — E782 Mixed hyperlipidemia: Secondary | ICD-10-CM | POA: Diagnosis not present

## 2019-12-15 MED ORDER — VARENICLINE TARTRATE 0.5 MG PO TABS: 0.5000 mg | ORAL_TABLET | Freq: Two times a day (BID) | ORAL | 2 refills | Status: DC

## 2019-12-15 MED ORDER — ATORVASTATIN CALCIUM 10 MG PO TABS: 10.0000 mg | ORAL_TABLET | Freq: Every day | ORAL | 2 refills | Status: DC

## 2019-12-15 NOTE — Assessment & Plan Note (Signed)
Well controlled.  ?No changes to medicines.  ?Continue to work on eating a healthy diet and exercise.  ?Labs drawn today.  ?

## 2019-12-15 NOTE — Assessment & Plan Note (Signed)
Provided smoking cessation counseling, Chantix ordered, follow up in 1 month.  Rx sent to pharmacy.

## 2019-12-15 NOTE — Progress Notes (Signed)
Established Patient Office Visit  Subjective:  Patient ID: Victoria Parsons, female    DOB: 06/17/1961  Age: 59 y.o. MRN: [id number]  CC:  Patietnt  is a 59 year old female, She  is here for 2 months follow up on hyperlipidemia. The patient's medications were reviewed and reconciled since the patient's last visit.  History details were provided by the patient. The history appears to be reliable.   Chief Complaint  Patient presents with  . Hyperlipidemia    HPI Victoria Parsons presents for Hyperlipidemia This is a recurrent problem. The current episode started more than 1 year ago. The problem is controlled. Recent lipid tests were reviewed and are high. Exacerbating diseases include obesity. Factors aggravating her hyperlipidemia include smoking. Pertinent negatives include no chest pain, focal sensory loss, focal weakness, leg pain, myalgias or shortness of breath. Current antihyperlipidemic treatment includes statins. The current treatment provides significant improvement of lipids. There are no compliance problems.  Risk factors for coronary artery disease include stress and family history.    Past Medical History:  Diagnosis Date  . Cancer ([place])   . Hypercholesterolemia     Past Surgical History:  Procedure Laterality Date  . BREAST SURGERY    . TONSILLECTOMY      Family History  Problem Relation Age of Onset  . Hypertension Mother   . Hyperlipidemia Mother   . Diabetes Mother   . Arthritis Mother   . Hypertension Father   . Hyperlipidemia Father   . Diabetes Father     Social History   Socioeconomic History  . Marital status: Married    Spouse name: Not on file  . Number of children: Not on file  . Years of education: Not on file  . Highest education level: Not on file  Occupational History    Employer: [employer]  Tobacco Use  . Smoking status: Current Every Day Smoker  . Smokeless tobacco: Never Used  Substance and Sexual Activity  . Alcohol use: Not  Currently  . Drug use: Never  . Sexual activity: Not on file  Other Topics Concern  . Not on file  Social History Narrative  . Not on file   Social Determinants of Health   Financial Resource Strain:   . Difficulty of Paying Living Expenses:   Food Insecurity:   . Worried About Charity fundraiser in the Last Year:   . Arboriculturist in the Last Year:   Transportation Needs:   . Film/video editor (Medical):   Marland Kitchen Lack of Transportation (Non-Medical):   Physical Activity:   . Days of Exercise per Week:   . Minutes of Exercise per Session:   Stress:   . Feeling of Stress :   Social Connections:   . Frequency of Communication with Friends and Family:   . Frequency of Social Gatherings with Friends and Family:   . Attends Religious Services:   . Active Member of Clubs or Organizations:   . Attends Archivist Meetings:   Marland Kitchen Marital Status:   Intimate Partner Violence:   . Fear of Current or Ex-Partner:   . Emotionally Abused:   Marland Kitchen Physically Abused:   . Sexually Abused:     Outpatient Medications Prior to Visit  Medication Sig Dispense Refill  . acetaminophen (TYLENOL) 500 MG tablet Take 1,500 mg by mouth every 6 (six) hours as needed for headache.    . anastrozole (ARIMIDEX) 1 MG tablet Take 1 mg by mouth  at bedtime.     Marland Kitchen aspirin EC 81 MG tablet Take 81 mg by mouth daily.    . calcium-vitamin D (OSCAL WITH D) 500-200 MG-UNIT tablet Take 2 tablets by mouth at bedtime.    Marland Kitchen loratadine (CLARITIN) 10 MG tablet Take 10 mg by mouth at bedtime.     . Omega-3 300 MG CAPS Take 1 capsule by mouth at bedtime.    Marland Kitchen atorvastatin (LIPITOR) 10 MG tablet Take 10 mg by mouth at bedtime.      No facility-administered medications prior to visit.    Allergies  Allergen Reactions  . Penicillins Other (See Comments)    DID THE REACTION INVOLVE: Swelling of the face/tongue/throat, SOB, or low BP? Yes Sudden or severe rash/hives, skin peeling, or the inside of the mouth or  nose? Yes Did it require medical treatment? Yes When did it last happen?15-20 YRS AGO If all above answers are "NO", may proceed with cephalosporin use.     ROS Review of Systems  Constitutional: Negative for activity change, appetite change, chills and fatigue.  HENT: Negative for congestion, ear discharge, ear pain, facial swelling, hearing loss, mouth sores and sinus pain.   Eyes: Positive for redness. Negative for pain, discharge and itching.  Respiratory: Negative for cough, choking, chest tightness and shortness of breath.   Cardiovascular: Negative for chest pain and palpitations.  Gastrointestinal: Negative for abdominal distention, abdominal pain, diarrhea and nausea.  Genitourinary: Negative for difficulty urinating.  Musculoskeletal: Negative for arthralgias, joint swelling and myalgias.  Skin: Negative for rash.  Neurological: Negative for focal weakness, weakness, numbness and headaches.  Psychiatric/Behavioral: Negative for agitation.      Objective:    Physical Exam  Constitutional: She is oriented to person, place, and time. She appears well-developed and well-nourished. No distress.  HENT:  Head: Normocephalic.  Right Ear: External ear normal.  Left Ear: External ear normal.  Nose: Nose normal.  Mouth/Throat: Oropharynx is clear and moist.  Eyes: Pupils are equal, round, and reactive to light. Right eye exhibits no discharge. Left eye exhibits no discharge.  Cardiovascular: Normal rate, regular rhythm and normal heart sounds.  Pulmonary/Chest: Effort normal and breath sounds normal. No respiratory distress.  Abdominal: Soft. Bowel sounds are normal. There is no abdominal tenderness.  Musculoskeletal:        General: Normal range of motion.     Cervical back: Normal range of motion and neck supple.  Neurological: She is alert and oriented to person, place, and time. She has normal reflexes.  Skin: No rash noted. She is not diaphoretic.  Psychiatric: She  has a normal mood and affect.    BP 136/80   Pulse 88   Temp 98 F (36.7 C)   Ht 5\' 2"  (1.575 m)   Wt 160 lb 3.2 oz (72.7 kg)   SpO2 98%   BMI 29.30 kg/m  Wt Readings from Last 3 Encounters:  12/15/19 160 lb 3.2 oz (72.7 kg)     Health Maintenance Due  Topic Date Due  . Hepatitis C Screening  Never done  . HIV Screening  Never done  . TETANUS/TDAP  Never done  . PAP SMEAR-Modifier  Never done  . MAMMOGRAM  Never done  . COLONOSCOPY  Never done    There are no preventive care reminders to display for this patient.  No results found for: TSH Lab Results  Component Value Date   WBC 7.5 12/15/2019   HGB 15.4 12/15/2019   HCT 46.7 (H)  12/15/2019   MCV 98 (H) 12/15/2019   PLT 302 12/15/2019   Lab Results  Component Value Date   NA 141 12/15/2019   K 4.4 12/15/2019   CO2 21 12/15/2019   GLUCOSE 88 12/15/2019   BUN 15 12/15/2019   CREATININE 1.02 (H) 12/15/2019   BILITOT 0.3 12/15/2019   ALKPHOS 122 (H) 12/15/2019   AST 23 12/15/2019   ALT 24 12/15/2019   PROT 6.9 12/15/2019   ALBUMIN 4.6 12/15/2019   CALCIUM 11.6 (H) 12/15/2019   ANIONGAP 8 09/29/2018   Lab Results  Component Value Date   CHOL 196 12/15/2019   Lab Results  Component Value Date   HDL 46 12/15/2019   Lab Results  Component Value Date   LDLCALC 117 (H) 12/15/2019   Lab Results  Component Value Date   TRIG 188 (H) 12/15/2019   Lab Results  Component Value Date   CHOLHDL 4.3 12/15/2019   No results found for: HGBA1C    Assessment & Plan:  Mixed hyperlipidemia Well controlled.  No changes to medicines.  Continue to work on eating a healthy diet and exercise.  Labs drawn today.   Need for vaccination Shingles vaccine given to patient with education   Encounter for smoking cessation counseling Provided smoking cessation counseling, Chantix ordered, follow up in 1 month.  Rx sent to pharmacy.  Problem List Items Addressed This Visit      Other   Mixed hyperlipidemia     Well controlled.  No changes to medicines.  Continue to work on eating a healthy diet and exercise.  Labs drawn today.       Relevant Medications   aspirin EC 81 MG tablet   atorvastatin (LIPITOR) 10 MG tablet   Other Relevant Orders   CBC with Differential/Platelet (Completed)   Comprehensive metabolic panel (Completed)   Lipid Panel (Completed)   Cardiovascular Risk Assessment (Completed)   Need for vaccination    Shingles vaccine given to patient with education       Relevant Orders   Varicella-zoster vaccine IM (Shingrix) (Completed)   Encounter for smoking cessation counseling - Primary    Provided smoking cessation counseling, Chantix ordered, follow up in 1 month.  Rx sent to pharmacy.      Infiltrating ductal carcinoma of left breast [facility])    Patient is followed by her oncologist and has upcoming appointment scheduled.      Relevant Medications   aspirin EC 81 MG tablet      Meds ordered this encounter  Medications  . atorvastatin (LIPITOR) 10 MG tablet    Sig: Take 1 tablet (10 mg total) by mouth at bedtime.    Dispense:  30 tablet    Refill:  2    Order Specific Question:   Supervising Provider    AnswerRochel Brome [id number]  . DISCONTD: varenicline (CHANTIX) 0.5 MG tablet    Sig: Take 1 tablet (0.5 mg total) by mouth 2 (two) times daily.    Dispense:  60 tablet    Refill:  2    Order Specific Question:   Supervising Provider    AnswerShelton Silvas    Follow-up: Return in about 6 months (around 06/16/2020).    Ivy Lynn, NP

## 2019-12-15 NOTE — Assessment & Plan Note (Signed)
Shingles vaccine given to patient with education

## 2019-12-15 NOTE — Patient Instructions (Addendum)
Patient to follow up in 6 months Mixed hyperlipidemia Well controlled.  No changes to medicines.  Continue to work on eating a healthy diet and exercise.  Labs drawn today.   Need for vaccination Shingles vaccine given to patient with education   Encounter for smoking cessation counseling Provided smoking cessation counseling, Chantix ordered, follow up in 1 month.  Rx sent to pharmacy.    High Cholesterol  High cholesterol is a condition in which the blood has high levels of a white, waxy, fat-like substance (cholesterol). The human body needs small amounts of cholesterol. The liver makes all the cholesterol that the body needs. Extra (excess) cholesterol comes from the food that we eat. Cholesterol is carried from the liver by the blood through the blood vessels. If you have high cholesterol, deposits (plaques) may build up on the walls of your blood vessels (arteries). Plaques make the arteries narrower and stiffer. Cholesterol plaques increase your risk for heart attack and stroke. Work with your health care provider to keep your cholesterol levels in a healthy range. What increases the risk? This condition is more likely to develop in people who:  Eat foods that are high in animal fat (saturated fat) or cholesterol.  Are overweight.  Are not getting enough exercise.  Have a family history of high cholesterol. What are the signs or symptoms? There are no symptoms of this condition. How is this diagnosed? This condition may be diagnosed from the results of a blood test.  If you are older than age 61, your health care provider may check your cholesterol every 4-6 years.  You may be checked more often if you already have high cholesterol or other risk factors for heart disease. The blood test for cholesterol measures:  "Bad" cholesterol (LDL cholesterol). This is the main type of cholesterol that causes heart disease. The desired level for LDL is less than 100.  "Good"  cholesterol (HDL cholesterol). This type helps to protect against heart disease by cleaning the arteries and carrying the LDL away. The desired level for HDL is 60 or higher.  Triglycerides. These are fats that the body can store or burn for energy. The desired number for triglycerides is lower than 150.  Total cholesterol. This is a measure of the total amount of cholesterol in your blood, including LDL cholesterol, HDL cholesterol, and triglycerides. A healthy number is less than 200. How is this treated? This condition is treated with diet changes, lifestyle changes, and medicines. Diet changes  This may include eating more whole grains, fruits, vegetables, nuts, and fish.  This may also include cutting back on red meat and foods that have a lot of added sugar. Lifestyle changes  Changes may include getting at least 40 minutes of aerobic exercise 3 times a week. Aerobic exercises include walking, biking, and swimming. Aerobic exercise along with a healthy diet can help you maintain a healthy weight.  Changes may also include quitting smoking. Medicines  Medicines are usually given if diet and lifestyle changes have failed to reduce your cholesterol to healthy levels.  Your health care provider may prescribe a statin medicine. Statin medicines have been shown to reduce cholesterol, which can reduce the risk of heart disease. Follow these instructions at home: Eating and drinking If told by your health care provider:  Eat chicken (without skin), fish, veal, shellfish, ground [place] breast, and round or loin cuts of red meat.  Do not eat fried foods or fatty meats, such as hot dogs and salami.  Eat plenty of fruits, such as apples.  Eat plenty of vegetables, such as broccoli, potatoes, and carrots.  Eat beans, peas, and lentils.  Eat grains such as barley, rice, couscous, and bulgur wheat.  Eat pasta without cream sauces.  Use skim or nonfat milk, and eat low-fat or nonfat  yogurt and cheeses.  Do not eat or drink whole milk, cream, ice cream, egg yolks, or hard cheeses.  Do not eat stick margarine or tub margarines that contain trans fats (also called partially hydrogenated oils).  Do not eat saturated tropical oils, such as coconut oil and palm oil.  Do not eat cakes, cookies, crackers, or other baked goods that contain trans fats.  General instructions  Exercise as directed by your health care provider. Increase your activity level with activities such as gardening, walking, and taking the stairs.  Take over-the-counter and prescription medicines only as told by your health care provider.  Do not use any products that contain nicotine or tobacco, such as cigarettes and e-cigarettes. If you need help quitting, ask your health care provider.  Keep all follow-up visits as told by your health care provider. This is important. Contact a health care provider if:  You are struggling to maintain a healthy diet or weight.  You need help to start on an exercise program.  You need help to stop smoking. Get help right away if:  You have chest pain.  You have trouble breathing. This information is not intended to replace advice given to you by your health care provider. Make sure you discuss any questions you have with your health care provider. Document Revised: 09/24/2017 Document Reviewed: 03/21/2016 Elsevier Patient Education  2020 [place] with Quitting Smoking  Quitting smoking is a physical and mental challenge. You will face cravings, withdrawal symptoms, and temptation. Before quitting, work with your health care provider to make a plan that can help you cope. Preparation can help you quit and keep you from giving in. How can I cope with cravings? Cravings usually last for 5-10 minutes. If you get through it, the craving will pass. Consider taking the following actions to help you cope with cravings:  Keep your mouth busy: ? Chew  sugar-free gum. ? Suck on hard candies or a straw. ? Brush your teeth.  Keep your hands and body busy: ? Immediately change to a different activity when you feel a craving. ? Squeeze or play with a ball. ? Do an activity or a hobby, like making bead jewelry, practicing needlepoint, or working with wood. ? Mix up your normal routine. ? Take a short exercise break. Go for a quick walk or run up and down stairs. ? Spend time in public places where smoking is not allowed.  Focus on doing something kind or helpful for someone else.  Call a friend or family member to talk during a craving.  Join a support group.  Call a quit line, such as 1-800-QUIT-NOW.  Talk with your health care provider about medicines that might help you cope with cravings and make quitting easier for you. How can I deal with withdrawal symptoms? Your body may experience negative effects as it tries to get used to not having nicotine in the system. These effects are called withdrawal symptoms. They may include:  Feeling hungrier than normal.  Trouble concentrating.  Irritability.  Trouble sleeping.  Feeling depressed.  Restlessness and agitation.  Craving a cigarette. To manage withdrawal symptoms:  Avoid places, people, and activities that  trigger your cravings.  Remember why you want to quit.  Get plenty of sleep.  Avoid coffee and other caffeinated drinks. These may worsen some of your symptoms. How can I handle social situations? Social situations can be difficult when you are quitting smoking, especially in the first few weeks. To manage this, you can:  Avoid parties, bars, and other social situations where people might be smoking.  Avoid alcohol.  Leave right away if you have the urge to smoke.  Explain to your family and friends that you are quitting smoking. Ask for understanding and support.  Plan activities with friends or family where smoking is not an option. What are some ways I  can cope with stress? Wanting to smoke may cause stress, and stress can make you want to smoke. Find ways to manage your stress. Relaxation techniques can help. For example:  Breathe slowly and deeply, in through your nose and out through your mouth.  Listen to soothing, relaxing music.  Talk with a family member or friend about your stress.  Light a candle.  Soak in a bath or take a shower.  Think about a peaceful place. What are some ways I can prevent weight gain? Be aware that many people gain weight after they quit smoking. However, not everyone does. To keep from gaining weight, have a plan in place before you quit and stick to the plan after you quit. Your plan should include:  Having healthy snacks. When you have a craving, it may help to: ? Eat plain popcorn, crunchy carrots, celery, or other cut vegetables. ? Chew sugar-free gum.  Changing how you eat: ? Eat small portion sizes at meals. ? Eat 4-6 small meals throughout the day instead of 1-2 large meals a day. ? Be mindful when you eat. Do not watch television or do other things that might distract you as you eat.  Exercising regularly: ? Make time to exercise each day. If you do not have time for a long workout, do short bouts of exercise for 5-10 minutes several times a day. ? Do some form of strengthening exercise, like weight lifting, and some form of aerobic exercise, like running or swimming.  Drinking plenty of water or other low-calorie or no-calorie drinks. Drink 6-8 glasses of water daily, or as much as instructed by your health care provider. Summary  Quitting smoking is a physical and mental challenge. You will face cravings, withdrawal symptoms, and temptation to smoke again. Preparation can help you as you go through these challenges.  You can cope with cravings by keeping your mouth busy (such as by chewing gum), keeping your body and hands busy, and making calls to family, friends, or a helpline for  people who want to quit smoking.  You can cope with withdrawal symptoms by avoiding places where people smoke, avoiding drinks with caffeine, and getting plenty of rest.  Ask your health care provider about the different ways to prevent weight gain, avoid stress, and handle social situations. This information is not intended to replace advice given to you by your health care provider. Make sure you discuss any questions you have with your health care provider. Document Revised: 09/03/2017 Document Reviewed: 09/18/2016 Elsevier Patient Education  [place]. Live Zoster (Shingles) Vaccine: What You Need to Know 1. Why get vaccinated? Live zoster (shingles) vaccine can prevent shingles. Shingles (also called herpes zoster, or just zoster) is a painful skin rash, usually with blisters. In addition to the rash, shingles can  cause fever, headache, chills, or upset stomach. More rarely, shingles can lead to pneumonia, hearing problems, blindness, brain inflammation (encephalitis), or death.  The most common complication of shingles is long-term nerve pain called postherpetic neuralgia (PHN). PHN occurs in the areas where the shingles rash was, even after the rash clears up. It can last for months or years after the rash goes away. The pain from PHN can be severe and debilitating. About 10 to 18% of people who get shingles will experience PHN. The risk of PHN increases with age. An older adult with shingles is more likely to develop PHN and have longer lasting and more severe pain than a younger person with shingles.  Shingles is caused by the varicella zoster virus, the same virus that causes chickenpox. After you have chickenpox, the virus stays in your body and can cause shingles later in life. Shingles cannot be passed from one person to another, but the virus that causes shingles can spread and cause chickenpox in someone who had never had chickenpox or received chickenpox vaccine. 2. Live  shingles vaccine Live shingles vaccine can provide protection against shingles and PHN.  Another type of shingles vaccine, recombinant shingles vaccine, is the preferred vaccine for the prevention of shingles. However, live shingles vaccine may be used in some circumstances (for example if a person is allergic to recombinant shingles vaccine or prefers live shingles vaccine, or if recombinant shingles vaccine is not available). Adults 60 years and older who get live shingles vaccine should receive 1 dose, administered by injection. Shingles vaccine may be given at the same time as other vaccines. 3. Talk with your health care provider Tell your vaccine provider if the person getting the vaccine:  Has had an allergic reaction after a previous dose of live shingles vaccine or varicella vaccine, or has any severe, life-threatening allergies.  Has a weakened immune system.  Is pregnant or thinks she might be pregnant.  Is currently experiencing an episode of shingles. In some cases, your health care provider may decide to postpone shingles vaccination to a future visit.  People with minor illnesses, such as a cold, may be vaccinated. People who are moderately or severely ill should usually wait until they recover before getting live shingles vaccine.  Your health care provider can give you more information. 4. Risks of a vaccine reaction  Redness, soreness, swelling, or itching at the site of the injection and headache can happen after live shingles vaccine. Rarely, live shingles vaccine can cause rash or shingles.  People sometimes faint after medical procedures, including vaccination. Tell your provider if you feel dizzy or have vision changes or ringing in the ears.  As with any medicine, there is a very remote chance of a vaccine causing a severe allergic reaction, other serious injury, or death. 5. What if there is a serious problem? An allergic reaction could occur after the vaccinated  person leaves the clinic. If you see signs of a severe allergic reaction (hives, swelling of the face and throat, difficulty breathing, a fast heartbeat, dizziness, or weakness), call 9-1-1 and get the person to the nearest hospital.  For other signs that concern you, call your health care provider.  Adverse reactions should be reported to the Vaccine Adverse Event Reporting System (VAERS). Your health care provider will usually file this report, or you can do it yourself. Visit the VAERS website at www.vaers.SamedayNews.es or call 207 778 5369. VAERS is only for reporting reactions, and VAERS staff do not give medical advice.  6. How can I learn more?  Ask your health care provider.  Call your local or state health department.  Contact the Centers for Disease Control and Prevention (CDC): ? Call 228-360-5771 (1-800-CDC-INFO) or ? Visit CDC's website at http://hunter.com/ CDC Vaccine Information Statement Live Zoster Vaccine (08/03/2018) This information is not intended to replace advice given to you by your health care provider. Make sure you discuss any questions you have with your health care provider. Document Revised: 01/10/2019 Document Reviewed: 05/03/2018 Elsevier Patient Education  [place].

## 2019-12-16 LAB — CBC WITH DIFFERENTIAL/PLATELET
Basophils Absolute: 0.1 10*3/uL (ref 0.0–0.2)
Basos: 1 %
EOS (ABSOLUTE): 0.2 10*3/uL (ref 0.0–0.4)
Eos: 2 %
Hematocrit: 46.7 % — ABNORMAL HIGH (ref 34.0–46.6)
Hemoglobin: 15.4 g/dL (ref 11.1–15.9)
Immature Grans (Abs): 0 10*3/uL (ref 0.0–0.1)
Immature Granulocytes: 0 %
Lymphocytes Absolute: 2 10*3/uL (ref 0.7–3.1)
Lymphs: 27 %
MCH: 32.4 pg (ref 26.6–33.0)
MCHC: 33 g/dL (ref 31.5–35.7)
MCV: 98 fL — ABNORMAL HIGH (ref 79–97)
Monocytes Absolute: 0.5 10*3/uL (ref 0.1–0.9)
Monocytes: 6 %
Neutrophils Absolute: 4.7 10*3/uL (ref 1.4–7.0)
Neutrophils: 64 %
Platelets: 302 10*3/uL (ref 150–450)
RBC: 4.75 x10E6/uL (ref 3.77–5.28)
RDW: 12 % (ref 11.7–15.4)
WBC: 7.5 10*3/uL (ref 3.4–10.8)

## 2019-12-16 LAB — COMPREHENSIVE METABOLIC PANEL
ALT: 24 IU/L (ref 0–32)
AST: 23 IU/L (ref 0–40)
Albumin/Globulin Ratio: 2 (ref 1.2–2.2)
Albumin: 4.6 g/dL (ref 3.8–4.9)
Alkaline Phosphatase: 122 IU/L — ABNORMAL HIGH (ref 39–117)
BUN/Creatinine Ratio: 15 (ref 9–23)
BUN: 15 mg/dL (ref 6–24)
Bilirubin Total: 0.3 mg/dL (ref 0.0–1.2)
CO2: 21 mmol/L (ref 20–29)
Calcium: 11.6 mg/dL — ABNORMAL HIGH (ref 8.7–10.2)
Chloride: 105 mmol/L (ref 96–106)
Creatinine, Ser: 1.02 mg/dL — ABNORMAL HIGH (ref 0.57–1.00)
GFR calc Af Amer: 70 mL/min/{1.73_m2} (ref >59)
GFR calc non Af Amer: 61 mL/min/{1.73_m2} (ref >59)
Globulin, Total: 2.3 g/dL (ref 1.5–4.5)
Glucose: 88 mg/dL (ref 65–99)
Potassium: 4.4 mmol/L (ref 3.5–5.2)
Sodium: 141 mmol/L (ref 134–144)
Total Protein: 6.9 g/dL (ref 6.0–8.5)

## 2019-12-16 LAB — LIPID PANEL
Chol/HDL Ratio: 4.3 ratio (ref 0.0–4.4)
Cholesterol, Total: 196 mg/dL (ref 100–199)
HDL: 46 mg/dL (ref >39)
LDL Chol Calc (NIH): 117 mg/dL — ABNORMAL HIGH (ref 0–99)
Triglycerides: 188 mg/dL — ABNORMAL HIGH (ref 0–149)
VLDL Cholesterol Cal: 33 mg/dL (ref 5–40)

## 2019-12-16 LAB — CARDIOVASCULAR RISK ASSESSMENT

## 2019-12-17 DIAGNOSIS — C50912 Malignant neoplasm of unspecified site of left female breast: Secondary | ICD-10-CM | POA: Insufficient documentation

## 2019-12-17 NOTE — Assessment & Plan Note (Signed)
Patient is followed by her oncologist and has upcoming appointment scheduled.

## 2019-12-25 LAB — HM MAMMOGRAPHY

## 2019-12-29 DIAGNOSIS — C50112 Malignant neoplasm of central portion of left female breast: Secondary | ICD-10-CM

## 2020-01-04 ENCOUNTER — Other Ambulatory Visit: Payer: Self-pay | Admitting: Nurse Practitioner

## 2020-01-04 DIAGNOSIS — Z716 Tobacco abuse counseling: Secondary | ICD-10-CM

## 2020-01-04 MED ORDER — BUPROPION HCL ER (SMOKING DET) 150 MG PO TB12: 150.0000 mg | ORAL_TABLET | Freq: Two times a day (BID) | ORAL | 0 refills | Status: DC

## 2020-01-04 NOTE — Progress Notes (Unsigned)
Insurance will not cover Chantix. Patient will start Wellbutrin for smoking cessation. Rx sent to pharmacy. Patient notified

## 2020-01-05 ENCOUNTER — Encounter: Payer: Self-pay | Admitting: Nurse Practitioner

## 2020-03-02 IMAGING — MR MR HEAD WO/W CM
10 of 13 series · 31 of 48 positions shown · IV contrast (gadavist)
Comparison: Prior CT from earlier the same day.

CLINICAL DATA: Initial evaluation for acute diplopia, headache.

EXAM:
MRI HEAD WITHOUT AND WITH CONTRAST
TECHNIQUE: Multiplanar, multiecho pulse sequences of the brain and surrounding
structures were obtained without and with intravenous contrast.
CONTRAST:  6.5 cc of Gadavist.

[Series 3: DWI · axial · 3.0mm · 0.94mm/px · z∈[-90,+57]mm · 8 of 100 slices shown (1 of 2)]
[im 1/100]
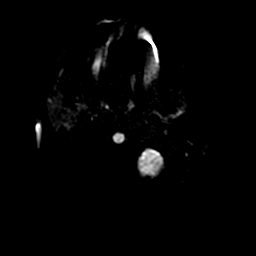
[im 15/100]
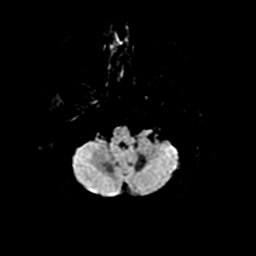
[im 29/100]
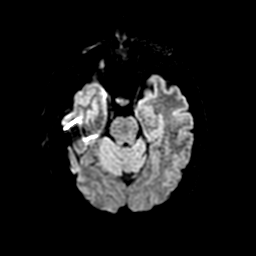
[im 43/100]
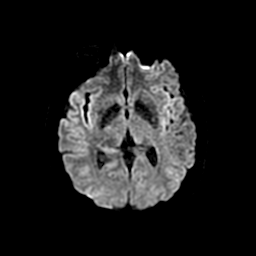
[im 57/100]
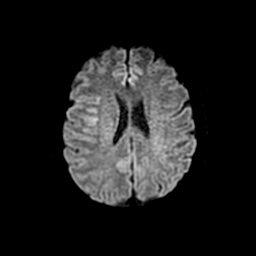
[im 71/100]
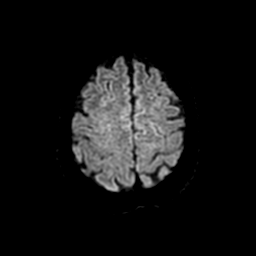
[im 85/100]
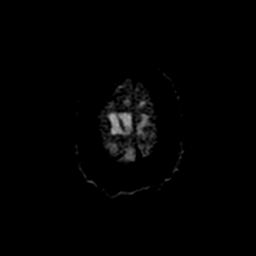
[im 100/100]
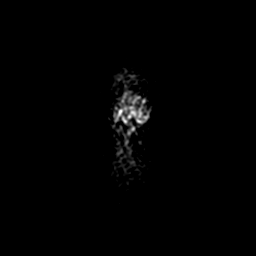

[Series 4: DWI · coronal · 4.0mm · 0.94mm/px · 5 of 72 slices shown (2 of 2)]
[im 1/72]
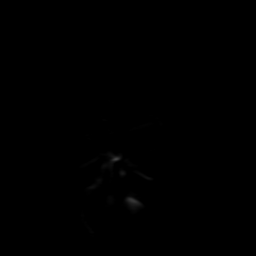
[im 18/72]
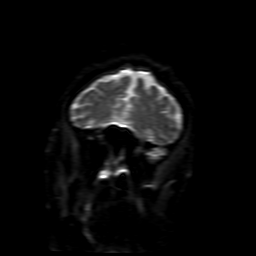
[im 36/72]
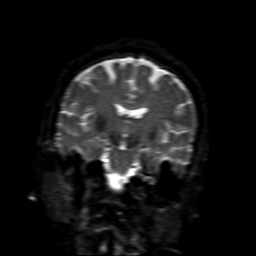
[im 54/72]
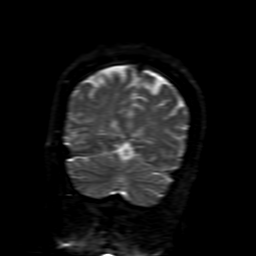
[im 72/72]
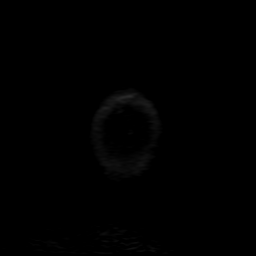

[Series 5: FLAIR · sagittal · 5.0mm · 0.47mm/px · 2 of 26 slices shown (1 of 2)]
[im 1/26]
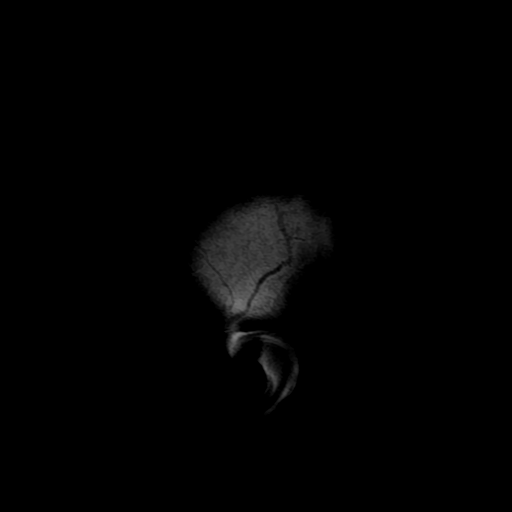
[im 26/26]
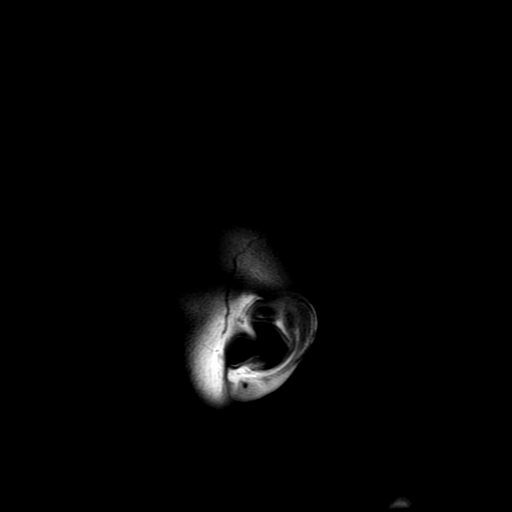

[Series 7: T2 · axial · 5.0mm · 0.47mm/px · z∈[-77,+73]mm · 2 of 26 slices shown]
[im 1/26]
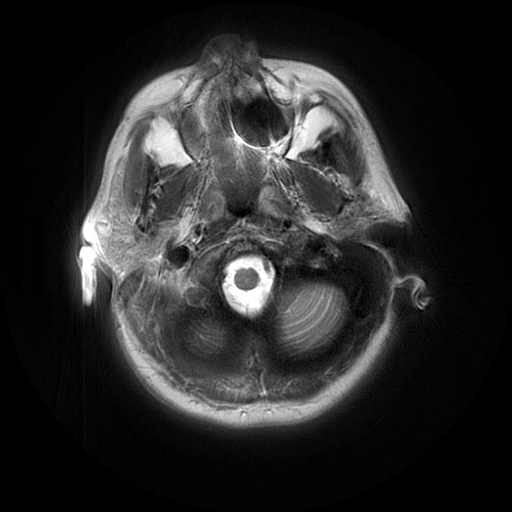
[im 26/26]
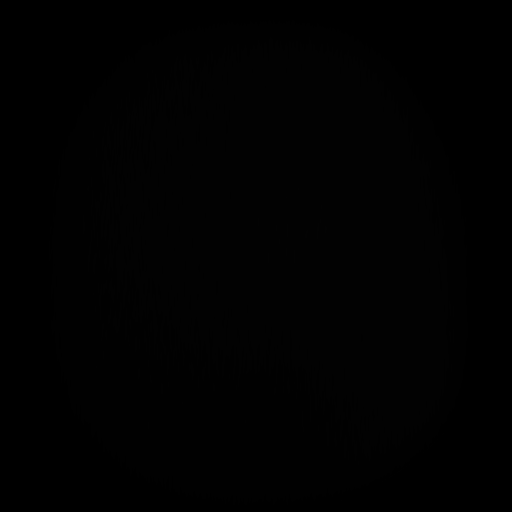

[Series 8: FLAIR · axial · 3.0mm · 0.45mm/px · z∈[-76,+74]mm · 2 of 26 slices shown (2 of 2)]
[im 1/26]
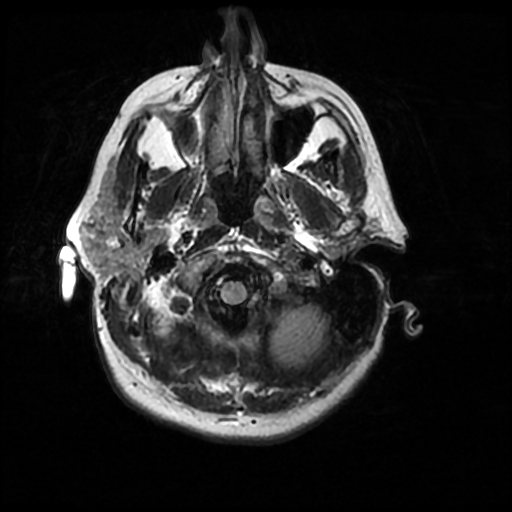
[im 26/26]
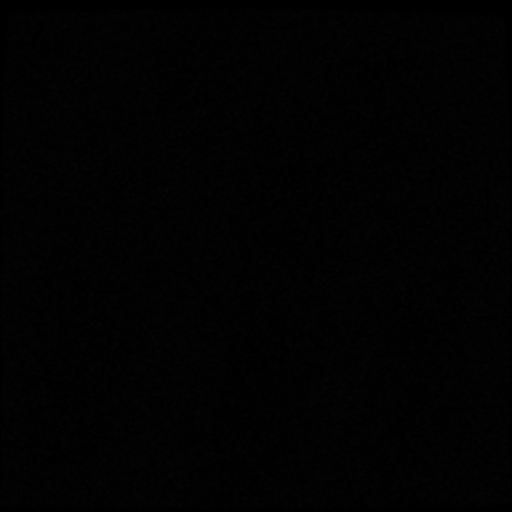

[Series 9: (person_name) · axial · 3.0mm · 0.47mm/px · z∈[-77,-28]mm · 3 of 100 slices shown]
[im 1/100]
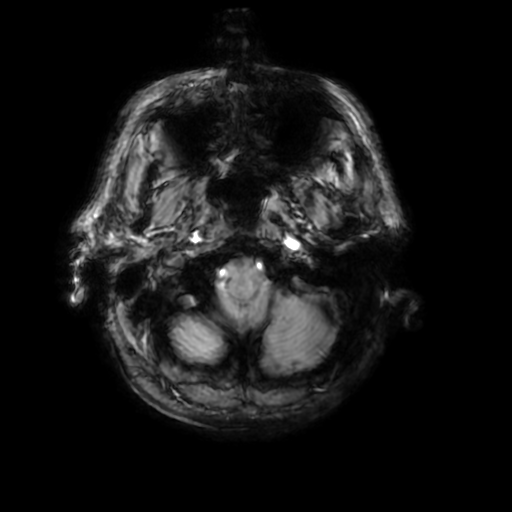
[im 17/100]
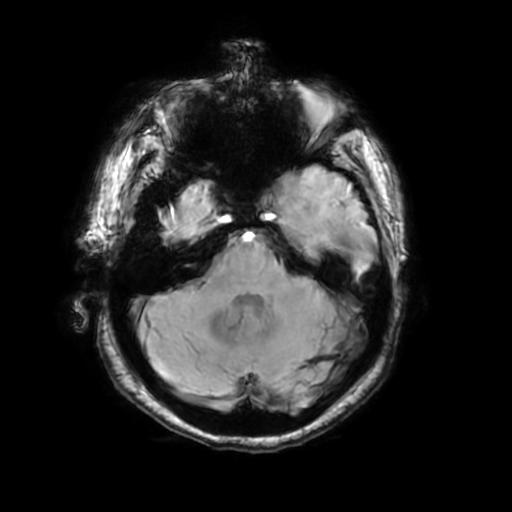
[im 34/100]
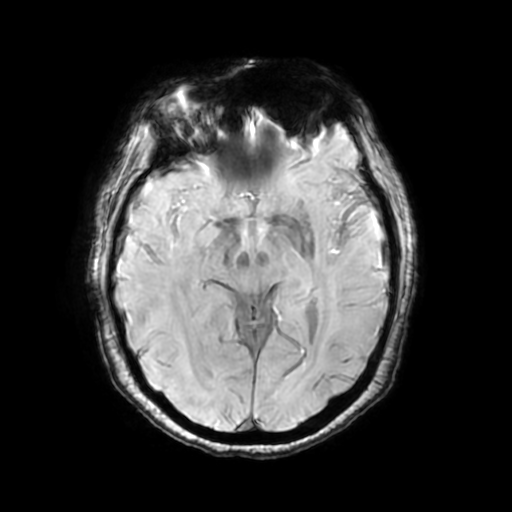

[Series 11: T2 post-contrast · coronal · 5.0mm · 0.39mm/px · 2 of 30 slices shown]
[im 1/30]
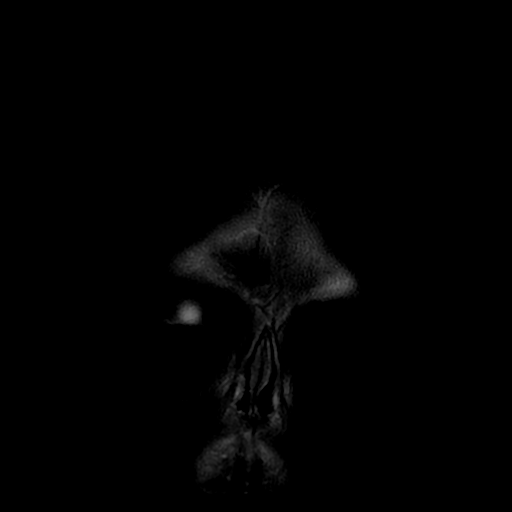
[im 30/30]
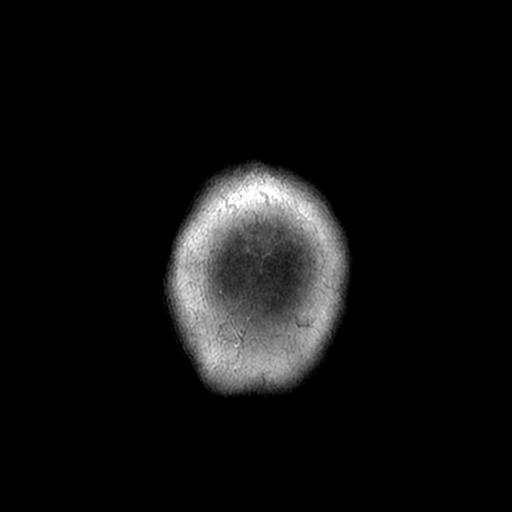

[Series 13: T1 · coronal · 5.0mm · 0.43mm/px · 2 of 30 slices shown]
[im 1/30]
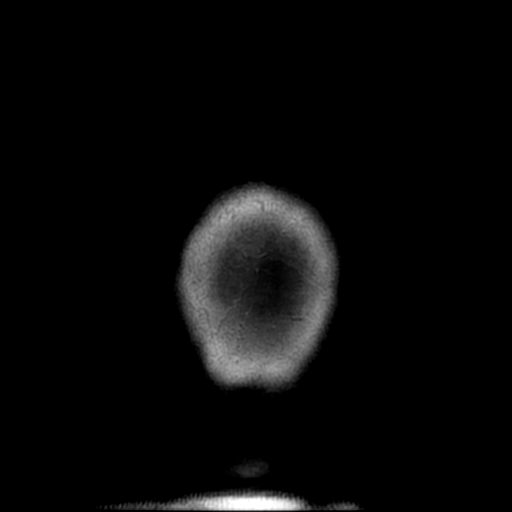
[im 30/30]
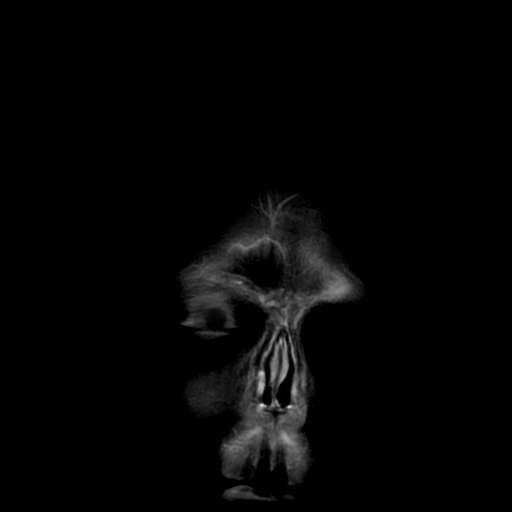

[Series 350: ADC · axial · 3.0mm · 0.94mm/px · z∈[-90,+57]mm · 3 of 50 slices shown (1 of 2)]
[im 1/50]
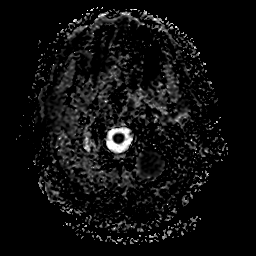
[im 25/50]
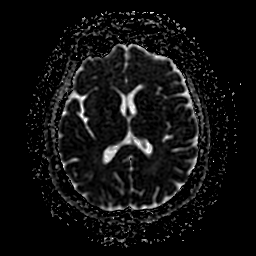
[im 50/50]
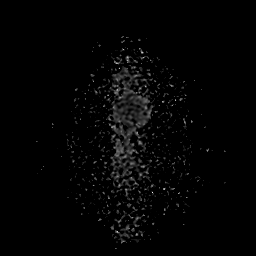

[Series 450: ADC · coronal · 4.0mm · 0.94mm/px · 2 of 36 slices shown (2 of 2)]
[im 1/36]
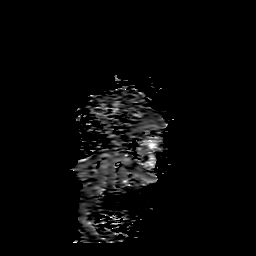
[im 36/36]
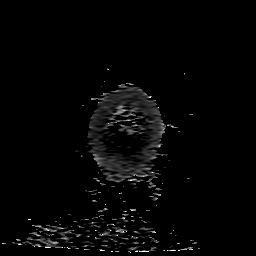

[31 of 48 positions shown; findings below may reference images not displayed]

FINDINGS: Brain: Cerebral volume within normal limits for age. Scattered
patchy T2/FLAIR hyperintensity within the periventricular, deep, and
subcortical white matter both cerebral hemispheres, nonspecific.
Overall, appearance is mild in nature. No abnormal foci of
restricted diffusion to suggest acute or subacute ischemia.
Gray-white matter differentiation maintained. No encephalomalacia to
suggest chronic infarction. No foci of susceptibility artifact to
suggest acute or chronic intracranial hemorrhage.

No mass lesion, midline shift or mass effect. No hydrocephalus. No
extra-axial fluid collection. Pituitary gland and suprasellar region
within normal limits. No abnormal enhancement.

Vascular: Major intracranial vascular flow voids maintained.

Skull and upper cervical spine: Craniocervical junction within
normal limits. Upper cervical spine normal. Bone marrow signal
intensity normal. No scalp soft tissue abnormality.

Sinuses/Orbits: Globes and orbital soft tissues demonstrate no acute
finding. Paranasal sinuses are largely clear. No mastoid effusion.
Inner ear structures normal.

Other: None.
IMPRESSION: 1. No acute intracranial abnormality.
2. Mild cerebral white matter change, nonspecific, but most commonly
related to mild chronic small vessel ischemic disease. Differential
considerations include sequelae of complicated migraine, vasculitis,
or prior infectious and/or inflammatory process. Changes would not
be typical for underlying demyelinating disease.

## 2020-03-27 ENCOUNTER — Ambulatory Visit (INDEPENDENT_AMBULATORY_CARE_PROVIDER_SITE_OTHER): Payer: 59 | Admitting: Physician Assistant

## 2020-03-27 ENCOUNTER — Encounter: Payer: Self-pay | Admitting: Physician Assistant

## 2020-03-27 ENCOUNTER — Other Ambulatory Visit: Payer: Self-pay

## 2020-03-27 VITALS — BP 118/78 | HR 68 | Temp 97.7°F | Ht 62.0 in | Wt 163.8 lb

## 2020-03-27 DIAGNOSIS — E782 Mixed hyperlipidemia: Secondary | ICD-10-CM | POA: Diagnosis not present

## 2020-03-27 DIAGNOSIS — C50912 Malignant neoplasm of unspecified site of left female breast: Secondary | ICD-10-CM

## 2020-03-27 DIAGNOSIS — Z716 Tobacco abuse counseling: Secondary | ICD-10-CM | POA: Diagnosis not present

## 2020-03-27 DIAGNOSIS — M25551 Pain in right hip: Secondary | ICD-10-CM | POA: Insufficient documentation

## 2020-03-27 MED ORDER — BUPROPION HCL ER (SR) 150 MG PO TB12: 150.0000 mg | ORAL_TABLET | Freq: Two times a day (BID) | ORAL | 1 refills | Status: DC

## 2020-03-27 MED ORDER — ATORVASTATIN CALCIUM 10 MG PO TABS: 10.0000 mg | ORAL_TABLET | Freq: Every day | ORAL | 1 refills | Status: DC

## 2020-03-27 NOTE — Patient Instructions (Signed)
Hip Bursitis Rehab Ask your health care provider which exercises are safe for you. Do exercises exactly as told by your health care provider and adjust them as directed. It is normal to feel mild stretching, pulling, tightness, or discomfort as you do these exercises. Stop right away if you feel sudden pain or your pain gets worse. Do not begin these exercises until told by your health care provider. Stretching exercise This exercise warms up your muscles and joints and improves the movement and flexibility of your hip. This exercise also helps to relieve pain and stiffness. Iliotibial band stretch An iliotibial band is a strong band of muscle tissue that runs from the outer side of your hip to the outer side of your thigh and knee. 1. Lie on your side with your left / right leg in the top position. 2. Bend your left / right knee and grab your ankle. Stretch out your bottom arm to help you balance. 3. Slowly bring your knee back so your thigh is behind your body. 4. Slowly lower your knee toward the floor until you feel a gentle stretch on the outside of your left / right thigh. If you do not feel a stretch and your knee will not fall farther, place the heel of your other foot on top of your knee and pull your knee down toward the floor with your foot. 5. Hold this position for __________ seconds. 6. Slowly return to the starting position. Repeat __________ times. Complete this exercise __________ times a day. Strengthening exercises These exercises build strength and endurance in your hip and pelvis. Endurance is the ability to use your muscles for a long time, even after they get tired. Bridge This exercise strengthens the muscles that move your thigh backward (hip extensors). 1. Lie on your back on a firm surface with your knees bent and your feet flat on the floor. 2. Tighten your buttocks muscles and lift your buttocks off the floor until your trunk is level with your thighs. ? Do not arch  your back. ? You should feel the muscles working in your buttocks and the back of your thighs. If you do not feel these muscles, slide your feet 1-2 inches (2.5-5 cm) farther away from your buttocks. ? If this exercise is too easy, try doing it with your arms crossed over your chest. 3. Hold this position for __________ seconds. 4. Slowly lower your hips to the starting position. 5. Let your muscles relax completely after each repetition. Repeat __________ times. Complete this exercise __________ times a day. Squats This exercise strengthens the muscles in front of your thigh and knee (quadriceps). 1. Stand in front of a table, with your feet and knees pointing straight ahead. You may rest your hands on the table for balance but not for support. 2. Slowly bend your knees and lower your hips like you are going to sit in a chair. ? Keep your weight over your heels, not over your toes. ? Keep your lower legs upright so they are parallel with the table legs. ? Do not let your hips go lower than your knees. ? Do not bend lower than told by your health care provider. ? If your hip pain increases, do not bend as low. 3. Hold the squat position for __________ seconds. 4. Slowly push with your legs to return to standing. Do not use your hands to pull yourself to standing. Repeat __________ times. Complete this exercise __________ times a day. Hip hike 1. Stand   sideways on a bottom step. Stand on your left / right leg with your other foot unsupported next to the step. You can hold on to the railing or wall for balance if needed. 2. Keep your knees straight and your torso square. Then lift your left / right hip up toward the ceiling. 3. Hold this position for __________ seconds. 4. Slowly let your left / right hip lower toward the floor, past the starting position. Your foot should get closer to the floor. Do not lean or bend your knees. Repeat __________ times. Complete this exercise __________ times a  day. Single leg stand 1. Without shoes, stand near a railing or in a doorway. You may hold on to the railing or door frame as needed for balance. 2. Squeeze your left / right buttock muscles, then lift up your other foot. ? Do not let your left / right hip push out to the side. ? It is helpful to stand in front of a mirror for this exercise so you can watch your hip. 3. Hold this position for __________ seconds. Repeat __________ times. Complete this exercise __________ times a day. This information is not intended to replace advice given to you by your health care provider. Make sure you discuss any questions you have with your health care provider. Document Revised: 01/16/2019 Document Reviewed: 01/16/2019 Elsevier Patient Education  2020 [employer]  

## 2020-03-27 NOTE — Progress Notes (Signed)
Acute Office Visit  Subjective:    Patient ID: Victoria Parsons, female    DOB: [birth year], 59 y.o.   MRN: [id number]  Chief Complaint  Patient presents with  . Hyperlipidemia    HPI Patient is in today for follow up hyperlipidemia Mixed hyperlipidemia  Pt presents with hyperlipidemia.Compliance with treatment has been goodThe patient is compliant with medications, maintains a low cholesterol diet , follows up as directed , and maintains an exercise regimen . The patient denies experiencing any hypercholesterolemia related symptoms. Pt currently on lipitor 10mg  qd  Pt is currently taking wellbutrin to try to help her stop smoking - she states she forgets to take second dose a lot of times - still smoking - states will try to remember better to take  Pt with history of breast cancer - she currently follows with oncology every 6 months - Dr Hinton Rao - symptoms have been stable - she is currently taking Arimidex  Pt states that every once in awhile with brisk walking she gets a pain in her right hip - at times pain extends down her leg - does not have pain at rest and is not consistent   Past Medical History:  Diagnosis Date  . Cancer ([place])   . Generalized anxiety disorder   . Hypercholesterolemia   . Mixed hyperlipidemia   . Sixth (abducent) nerve palsy, right eye     Past Surgical History:  Procedure Laterality Date  . BREAST SURGERY    . TONSILLECTOMY      Family History  Problem Relation Age of Onset  . Hypertension Mother   . Hyperlipidemia Mother   . Diabetes Mother   . Arthritis Mother   . Alzheimer's disease Mother   . Osteoarthritis Mother   . Hypertension Father   . Hyperlipidemia Father   . Diabetes Father   . Coronary artery disease Father   . Hypothyroidism Sister   . Anxiety disorder Sister   . Depression Sister     Social History   Socioeconomic History  . Marital status: Married    Spouse name: Not on file  . Number of children: 1  . Years  of education: Not on file  . Highest education level: Not on file  Occupational History    Employer: [employer]  Tobacco Use  . Smoking status: Current Every Day Smoker  . Smokeless tobacco: Never Used  Vaping Use  . Vaping Use: Never used  Substance and Sexual Activity  . Alcohol use: Not Currently  . Drug use: Never  . Sexual activity: Not on file  Other Topics Concern  . Not on file  Social History Narrative  . Not on file   Social Determinants of Health   Financial Resource Strain:   . Difficulty of Paying Living Expenses:   Food Insecurity:   . Worried About Charity fundraiser in the Last Year:   . Arboriculturist in the Last Year:   Transportation Needs:   . Film/video editor (Medical):   Marland Kitchen Lack of Transportation (Non-Medical):   Physical Activity:   . Days of Exercise per Week:   . Minutes of Exercise per Session:   Stress:   . Feeling of Stress :   Social Connections:   . Frequency of Communication with Friends and Family:   . Frequency of Social Gatherings with Friends and Family:   . Attends Religious Services:   . Active Member of Clubs or Organizations:   . Attends Club  or Organization Meetings:   Marland Kitchen Marital Status:   Intimate Partner Violence:   . Fear of Current or Ex-Partner:   . Emotionally Abused:   Marland Kitchen Physically Abused:   . Sexually Abused:      Current Outpatient Medications:  .  acetaminophen (TYLENOL) 500 MG tablet, Take 1,500 mg by mouth every 6 (six) hours as needed for headache., Disp: , Rfl:  .  anastrozole (ARIMIDEX) 1 MG tablet, Take 1 mg by mouth at bedtime. , Disp: , Rfl:  .  aspirin EC 81 MG tablet, Take 81 mg by mouth daily., Disp: , Rfl:  .  atorvastatin (LIPITOR) 10 MG tablet, Take 1 tablet (10 mg total) by mouth at bedtime., Disp: 90 tablet, Rfl: 1 .  calcium-vitamin D (OSCAL WITH D) 500-200 MG-UNIT tablet, Take 2 tablets by mouth at bedtime., Disp: , Rfl:  .  loratadine (CLARITIN) 10 MG tablet, Take 10 mg by mouth at  bedtime. , Disp: , Rfl:  .  Omega-3 300 MG CAPS, Take 1 capsule by mouth at bedtime., Disp: , Rfl:  .  buPROPion (WELLBUTRIN SR) 150 MG 12 hr tablet, Take 1 tablet (150 mg total) by mouth 2 (two) times daily., Disp: 180 tablet, Rfl: 1   Allergies  Allergen Reactions  . Penicillins Other (See Comments)    DID THE REACTION INVOLVE: Swelling of the face/tongue/throat, SOB, or low BP? Yes Sudden or severe rash/hives, skin peeling, or the inside of the mouth or nose? Yes Did it require medical treatment? Yes When did it last happen?15-20 YRS AGO If all above answers are "NO", may proceed with cephalosporin use.     CONSTITUTIONAL: Negative for chills, fatigue, fever, unintentional weight gain and unintentional weight loss.  E/N/T: Negative for ear pain, nasal congestion and sore throat.  CARDIOVASCULAR: Negative for chest pain, dizziness, palpitations and pedal edema.  RESPIRATORY: Negative for recent cough and dyspnea.  GASTROINTESTINAL: Negative for abdominal pain, acid reflux symptoms, constipation, diarrhea, nausea and vomiting.  MSK: see HPI  INTEGUMENTARY: Negative for rash.  PSYCHIATRIC: Negative for sleep disturbance and to question depression screen.  Negative for depression, negative for anhedonia.         Objective:    PHYSICAL EXAM:   VS: BP 118/78 (BP Location: Left Arm, Patient Position: Sitting)   Pulse 68   Temp 97.7 F (36.5 C) (Temporal)   Ht 5\' 2"  (1.575 m)   Wt 163 lb 12.8 oz (74.3 kg)   SpO2 99%   BMI 29.96 kg/m   GEN: Well nourished, well developed, in no acute distress  Cardiac: RRR; no murmurs, rubs, or gallops,no edema -  Respiratory:  normal respiratory rate and pattern with no distress - normal breath sounds with no rales, rhonchi, wheezes or rubs GI: normal bowel sounds, no masses or tenderness MS: no deformity or atrophy  Psych: euthymic mood, appropriate affect and demeanor   Wt Readings from Last 3 Encounters:  03/27/20 163 lb 12.8 oz  (74.3 kg)  12/15/19 160 lb 3.2 oz (72.7 kg)    Health Maintenance Due  Topic Date Due  . Hepatitis C Screening  Never done  . COVID-19 Vaccine (1) Never done  . HIV Screening  Never done  . TETANUS/TDAP  Never done  . PAP SMEAR-Modifier  Never done  . MAMMOGRAM  Never done  . COLONOSCOPY  Never done    There are no preventive care reminders to display for this patient.        Assessment & Plan:  Problem List Items Addressed This Visit      Other   Mixed hyperlipidemia - Primary    Well controlled.  No changes to medicines.  Continue to work on eating a healthy diet and exercise.  Labs drawn today.        Relevant Medications   atorvastatin (LIPITOR) 10 MG tablet   Other Relevant Orders   Lipid panel   Comprehensive metabolic panel   Encounter for smoking cessation counseling    Continue wellbutrin but try to remember to take second dose of med      Relevant Medications   buPROPion (WELLBUTRIN SR) 150 MG 12 hr tablet   Infiltrating ductal carcinoma of left breast ([place])    Continue follow up with oncology      Hip pain, acute, right    Tylenol as needed rom exercises given          Meds ordered this encounter  Medications  . atorvastatin (LIPITOR) 10 MG tablet    Sig: Take 1 tablet (10 mg total) by mouth at bedtime.    Dispense:  90 tablet    Refill:  1    Order Specific Question:   Supervising Provider    AnswerRochel Brome [id number]  . buPROPion (WELLBUTRIN SR) 150 MG 12 hr tablet    Sig: Take 1 tablet (150 mg total) by mouth 2 (two) times daily.    Dispense:  180 tablet    Refill:  1    Order Specific Question:   Supervising Provider    Answer:   COX, Lynder Parents     [place], PA-C

## 2020-03-27 NOTE — Assessment & Plan Note (Signed)
Tylenol as needed rom exercises given

## 2020-03-27 NOTE — Assessment & Plan Note (Signed)
Continue follow-up with oncology 

## 2020-03-27 NOTE — Assessment & Plan Note (Signed)
Well controlled.  ?No changes to medicines.  ?Continue to work on eating a healthy diet and exercise.  ?Labs drawn today.  ?

## 2020-03-27 NOTE — Assessment & Plan Note (Signed)
Continue wellbutrin but try to remember to take second dose of med

## 2020-03-28 ENCOUNTER — Other Ambulatory Visit: Payer: Self-pay | Admitting: Physician Assistant

## 2020-03-28 DIAGNOSIS — R799 Abnormal finding of blood chemistry, unspecified: Secondary | ICD-10-CM

## 2020-03-28 LAB — COMPREHENSIVE METABOLIC PANEL
ALT: 28 IU/L (ref 0–32)
AST: 23 IU/L (ref 0–40)
Albumin/Globulin Ratio: 1.9 (ref 1.2–2.2)
Albumin: 4.5 g/dL (ref 3.8–4.9)
Alkaline Phosphatase: 124 IU/L — ABNORMAL HIGH (ref 48–121)
BUN/Creatinine Ratio: 18 (ref 9–23)
BUN: 19 mg/dL (ref 6–24)
Bilirubin Total: 0.3 mg/dL (ref 0.0–1.2)
CO2: 22 mmol/L (ref 20–29)
Calcium: 10.9 mg/dL — ABNORMAL HIGH (ref 8.7–10.2)
Chloride: 107 mmol/L — ABNORMAL HIGH (ref 96–106)
Creatinine, Ser: 1.07 mg/dL — ABNORMAL HIGH (ref 0.57–1.00)
GFR calc Af Amer: 66 mL/min/{1.73_m2} (ref >59)
GFR calc non Af Amer: 57 mL/min/{1.73_m2} — ABNORMAL LOW (ref >59)
Globulin, Total: 2.4 g/dL (ref 1.5–4.5)
Glucose: 93 mg/dL (ref 65–99)
Potassium: 5.1 mmol/L (ref 3.5–5.2)
Sodium: 141 mmol/L (ref 134–144)
Total Protein: 6.9 g/dL (ref 6.0–8.5)

## 2020-03-28 LAB — LIPID PANEL
Chol/HDL Ratio: 4.3 ratio (ref 0.0–4.4)
Cholesterol, Total: 177 mg/dL (ref 100–199)
HDL: 41 mg/dL (ref >39)
LDL Chol Calc (NIH): 115 mg/dL — ABNORMAL HIGH (ref 0–99)
Triglycerides: 114 mg/dL (ref 0–149)
VLDL Cholesterol Cal: 21 mg/dL (ref 5–40)

## 2020-03-28 LAB — CARDIOVASCULAR RISK ASSESSMENT

## 2020-04-11 ENCOUNTER — Ambulatory Visit: Payer: 59

## 2020-04-11 ENCOUNTER — Other Ambulatory Visit: Payer: Self-pay

## 2020-04-11 DIAGNOSIS — R799 Abnormal finding of blood chemistry, unspecified: Secondary | ICD-10-CM

## 2020-04-12 ENCOUNTER — Other Ambulatory Visit: Payer: Self-pay | Admitting: Physician Assistant

## 2020-04-12 DIAGNOSIS — R799 Abnormal finding of blood chemistry, unspecified: Secondary | ICD-10-CM

## 2020-04-12 LAB — PTH, INTACT AND CALCIUM
Calcium: 11.6 mg/dL — ABNORMAL HIGH (ref 8.7–10.2)
PTH: 41 pg/mL (ref 15–65)

## 2020-05-13 ENCOUNTER — Other Ambulatory Visit: Payer: Self-pay

## 2020-05-13 ENCOUNTER — Other Ambulatory Visit: Payer: 59

## 2020-05-13 DIAGNOSIS — R799 Abnormal finding of blood chemistry, unspecified: Secondary | ICD-10-CM

## 2020-05-14 LAB — COMPREHENSIVE METABOLIC PANEL
ALT: 31 IU/L (ref 0–32)
AST: 24 IU/L (ref 0–40)
Albumin/Globulin Ratio: 2 (ref 1.2–2.2)
Albumin: 4.5 g/dL (ref 3.8–4.9)
Alkaline Phosphatase: 139 IU/L — ABNORMAL HIGH (ref 48–121)
BUN/Creatinine Ratio: 21 (ref 9–23)
BUN: 20 mg/dL (ref 6–24)
Bilirubin Total: 0.3 mg/dL (ref 0.0–1.2)
CO2: 20 mmol/L (ref 20–29)
Calcium: 10.6 mg/dL — ABNORMAL HIGH (ref 8.7–10.2)
Chloride: 103 mmol/L (ref 96–106)
Creatinine, Ser: 0.96 mg/dL (ref 0.57–1.00)
GFR calc Af Amer: 75 mL/min/{1.73_m2} (ref >59)
GFR calc non Af Amer: 65 mL/min/{1.73_m2} (ref >59)
Globulin, Total: 2.2 g/dL (ref 1.5–4.5)
Glucose: 92 mg/dL (ref 65–99)
Potassium: 4 mmol/L (ref 3.5–5.2)
Sodium: 141 mmol/L (ref 134–144)
Total Protein: 6.7 g/dL (ref 6.0–8.5)

## 2020-06-17 ENCOUNTER — Encounter: Payer: 59 | Admitting: Nurse Practitioner

## 2020-06-18 ENCOUNTER — Encounter: Payer: Self-pay | Admitting: Physician Assistant

## 2020-06-18 ENCOUNTER — Ambulatory Visit (INDEPENDENT_AMBULATORY_CARE_PROVIDER_SITE_OTHER): Payer: 59 | Admitting: Physician Assistant

## 2020-06-18 ENCOUNTER — Other Ambulatory Visit: Payer: Self-pay

## 2020-06-18 VITALS — BP 136/78 | HR 76 | Temp 97.7°F | Ht 62.0 in | Wt 170.8 lb

## 2020-06-18 DIAGNOSIS — R87618 Other abnormal cytological findings on specimens from cervix uteri: Secondary | ICD-10-CM | POA: Diagnosis not present

## 2020-06-18 DIAGNOSIS — Z0000 Encounter for general adult medical examination without abnormal findings: Secondary | ICD-10-CM

## 2020-06-18 DIAGNOSIS — Z1211 Encounter for screening for malignant neoplasm of colon: Secondary | ICD-10-CM

## 2020-06-18 NOTE — Progress Notes (Signed)
Subjective:  Patient ID: Victoria Parsons, female    DOB: [birth year]  Age: 59 y.o. MRN: [id number]  Chief Complaint  Patient presents with  . Annual Exam    HPI Well Adult Physical: Patient here for a comprehensive physical exam.The patient reports no problems Do you take any herbs or supplements that were not prescribed by a doctor? no Are you taking calcium supplements? no Are you taking aspirin daily? no  Encounter for general adult medical examination without abnormal findings  Physical - last pap several years ago - overdue to see GYN Blood Pressure: borderline 136/78 Medical History: Patient history reviewed ; Family history reviewed ;  Allergies Reviewed: No change in current allergies ;  Medications Reviewed: Medications reviewed - no changes ;  Lipids: Normal lipid levels ;  Smoking: smoker Physical Activity: occasionally exercises Alcohol/Drug Use: rare alcohol ; No illicit drug use ;  Patient is not afflicted from Stress Incontinence and Urge Incontinence  Safety: reviewed ; Patient wears a seat belt, has smoke detectors, has carbon monoxide detectors Dental Care: biannual cleanings, brushes and flosses daily. Ophthalmology/Optometry: Annual visit.  Hearing loss: none Vision impairments: wears glasses  Last mammogram 3/21 - diagnostic  = per patient normal Last dexa 2020 - osteopenia    Office Visit from 12/15/2019 in [place]  PHQ-2 Total Score 0              Social Hx   Social History   Socioeconomic History  . Marital status: Married    Spouse name: Not on file  . Number of children: 1  . Years of education: Not on file  . Highest education level: Not on file  Occupational History    Employer: [employer]  Tobacco Use  . Smoking status: Current Every Day Smoker  . Smokeless tobacco: Never Used  Vaping Use  . Vaping Use: Never used  Substance and Sexual Activity  . Alcohol use: Not Currently  . Drug use: Never  . Sexual activity: Not  on file  Other Topics Concern  . Not on file  Social History Narrative  . Not on file   Social Determinants of Health   Financial Resource Strain:   . Difficulty of Paying Living Expenses: Not on file  Food Insecurity:   . Worried About Charity fundraiser in the Last Year: Not on file  . Ran Out of Food in the Last Year: Not on file  Transportation Needs:   . Lack of Transportation (Medical): Not on file  . Lack of Transportation (Non-Medical): Not on file  Physical Activity:   . Days of Exercise per Week: Not on file  . Minutes of Exercise per Session: Not on file  Stress:   . Feeling of Stress : Not on file  Social Connections:   . Frequency of Communication with Friends and Family: Not on file  . Frequency of Social Gatherings with Friends and Family: Not on file  . Attends Religious Services: Not on file  . Active Member of Clubs or Organizations: Not on file  . Attends Archivist Meetings: Not on file  . Marital Status: Not on file   Past Medical History:  Diagnosis Date  . Cancer ([place])   . Generalized anxiety disorder   . Hypercholesterolemia   . Mixed hyperlipidemia   . Sixth (abducent) nerve palsy, right eye    Past Surgical History:  Procedure Laterality Date  . BREAST SURGERY    . TONSILLECTOMY  Family History  Problem Relation Age of Onset  . Hypertension Mother   . Hyperlipidemia Mother   . Diabetes Mother   . Arthritis Mother   . Alzheimer's disease Mother   . Osteoarthritis Mother   . Hypertension Father   . Hyperlipidemia Father   . Diabetes Father   . Coronary artery disease Father   . Hypothyroidism Sister   . Anxiety disorder Sister   . Depression Sister     Review of Systems CONSTITUTIONAL: Negative for chills, fatigue, fever, unintentional weight gain and unintentional weight loss.  E/N/T: Negative for ear pain, nasal congestion and sore throat.  CARDIOVASCULAR: Negative for chest pain, dizziness, palpitations and pedal  edema.  RESPIRATORY: Negative for recent cough and dyspnea.  GASTROINTESTINAL: Negative for abdominal pain, acid reflux symptoms, constipation, diarrhea, nausea and vomiting.  MSK: Negative for arthralgias and myalgias.  INTEGUMENTARY: Negative for rash.  NEUROLOGICAL: Negative for dizziness and headaches.  PSYCHIATRIC: Negative for sleep disturbance and to question depression screen.  Negative for depression, negative for anhedonia.       Objective:  BP 136/78 (BP Location: Left Arm, Patient Position: Sitting)   Pulse 76   Temp 97.7 F (36.5 C) (Temporal)   Ht 5\' 2"  (1.575 m)   Wt 170 lb 12.8 oz (77.5 kg)   SpO2 99%   BMI 31.24 kg/m   BP/Weight 06/18/2020 03/27/2020 2/58/5277  Systolic BP 824 235 361  Diastolic BP 78 78 80  Wt. (Lbs) 170.8 163.8 160.2  BMI 31.24 29.96 29.3    Physical Exam PHYSICAL EXAM:   VS: BP 136/78 (BP Location: Left Arm, Patient Position: Sitting)   Pulse 76   Temp 97.7 F (36.5 C) (Temporal)   Ht 5\' 2"  (1.575 m)   Wt 170 lb 12.8 oz (77.5 kg)   SpO2 99%   BMI 31.24 kg/m   GEN: Well nourished, well developed, in no acute distress  HEENT: normal external ears and nose - normal external auditory canals and TMS - hearing grossly normal -- Lips, Teeth and Gums - normal  Oropharynx - normal mucosa, palate, and posterior pharynx Cardiac: RRR; no murmurs, rubs, or gallops,no edema -  Respiratory:  normal respiratory rate and pattern with no distress - normal breath sounds with no rales, rhonchi, wheezes or rubs GI: normal bowel sounds, no masses or tenderness MS: no deformity or atrophy  Skin: warm and dry, no rash  Neuro:  Alert and Oriented x 3, Strength and sensation are intact - CN II-Xii grossly intact Psych: euthymic mood, appropriate affect and demeanor  Lab Results  Component Value Date   WBC 7.5 12/15/2019   HGB 15.4 12/15/2019   HCT 46.7 (H) 12/15/2019   PLT 302 12/15/2019   GLUCOSE 92 05/13/2020   CHOL 177 03/27/2020   TRIG 114  03/27/2020   HDL 41 03/27/2020   LDLCALC 115 (H) 03/27/2020   ALT 31 05/13/2020   AST 24 05/13/2020   NA 141 05/13/2020   K 4.0 05/13/2020   CL 103 05/13/2020   CREATININE 0.96 05/13/2020   BUN 20 05/13/2020   CO2 20 05/13/2020   INR 1.02 09/29/2018      Assessment & Plan:  1. Routine physical examination - CBC with Differential/Platelet - Comprehensive metabolic panel - TSH - Lipid panel  2. Colon cancer screening - Ambulatory referral to Gastroenterology  3. Other abnormal cytological finding of specimen from cervix - Ambulatory referral to Obstetrics / Gynecology    Body mass index is 31.24 kg/m.  These are the goals we discussed: Goals    . Quit Smoking        This is a list of the screening recommended for you and due dates:  Health Maintenance  Topic Date Due  . Pap Smear  Never done  . Mammogram  Never done  . Colon Cancer Screening  Never done  . COVID-19 Vaccine (1) 07/04/2020*  . DEXA scan (bone density measurement)  04/04/2021  . Tetanus Vaccine  06/07/2029  . Flu Shot  Discontinued  .  Hepatitis C: One time screening is recommended by Center for Disease Control  (CDC) for  adults born from [birth year] through 1965.   Discontinued  . HIV Screening  Discontinued  *Topic was postponed. The date shown is not the original due date.     AN INDIVIDUALIZED CARE PLAN: was established or reinforced today.   SELF MANAGEMENT: The patient and I together assessed ways to personally work towards obtaining the recommended goals  Support needs The patient and/or family needs were assessed and services were offered if appropriate.  No orders of the defined types were placed in this encounter.   Follow-up: Return in about 6 months (around 12/16/2020) for chronic fasting.  An After Visit Summary was printed and given to the patient.  [facility] [phone]

## 2020-06-18 NOTE — Assessment & Plan Note (Signed)
labwork pending Referral to GYN Referral for colonoscopy

## 2020-06-18 NOTE — Patient Instructions (Signed)

## 2020-06-19 LAB — COMPREHENSIVE METABOLIC PANEL
ALT: 28 IU/L (ref 0–32)
AST: 24 IU/L (ref 0–40)
Albumin/Globulin Ratio: 1.7 (ref 1.2–2.2)
Albumin: 4.3 g/dL (ref 3.8–4.9)
Alkaline Phosphatase: 144 IU/L — ABNORMAL HIGH (ref 44–121)
BUN/Creatinine Ratio: 18 (ref 9–23)
BUN: 17 mg/dL (ref 6–24)
Bilirubin Total: 0.3 mg/dL (ref 0.0–1.2)
CO2: 22 mmol/L (ref 20–29)
Calcium: 10.6 mg/dL — ABNORMAL HIGH (ref 8.7–10.2)
Chloride: 107 mmol/L — ABNORMAL HIGH (ref 96–106)
Creatinine, Ser: 0.94 mg/dL (ref 0.57–1.00)
GFR calc Af Amer: 77 mL/min/{1.73_m2} (ref >59)
GFR calc non Af Amer: 67 mL/min/{1.73_m2} (ref >59)
Globulin, Total: 2.6 g/dL (ref 1.5–4.5)
Glucose: 91 mg/dL (ref 65–99)
Potassium: 4.6 mmol/L (ref 3.5–5.2)
Sodium: 140 mmol/L (ref 134–144)
Total Protein: 6.9 g/dL (ref 6.0–8.5)

## 2020-06-19 LAB — CBC WITH DIFFERENTIAL/PLATELET
Basophils Absolute: 0.1 10*3/uL (ref 0.0–0.2)
Basos: 1 %
EOS (ABSOLUTE): 0.1 10*3/uL (ref 0.0–0.4)
Eos: 2 %
Hematocrit: 46.2 % (ref 34.0–46.6)
Hemoglobin: 15.5 g/dL (ref 11.1–15.9)
Immature Grans (Abs): 0 10*3/uL (ref 0.0–0.1)
Immature Granulocytes: 0 %
Lymphocytes Absolute: 1.7 10*3/uL (ref 0.7–3.1)
Lymphs: 23 %
MCH: 32.4 pg (ref 26.6–33.0)
MCHC: 33.5 g/dL (ref 31.5–35.7)
MCV: 97 fL (ref 79–97)
Monocytes Absolute: 0.5 10*3/uL (ref 0.1–0.9)
Monocytes: 7 %
Neutrophils Absolute: 4.9 10*3/uL (ref 1.4–7.0)
Neutrophils: 67 %
Platelets: 285 10*3/uL (ref 150–450)
RBC: 4.78 x10E6/uL (ref 3.77–5.28)
RDW: 12.1 % (ref 11.7–15.4)
WBC: 7.4 10*3/uL (ref 3.4–10.8)

## 2020-06-19 LAB — TSH: TSH: 0.83 u[IU]/mL (ref 0.450–4.500)

## 2020-06-19 LAB — LIPID PANEL
Chol/HDL Ratio: 5 ratio — ABNORMAL HIGH (ref 0.0–4.4)
Cholesterol, Total: 203 mg/dL — ABNORMAL HIGH (ref 100–199)
HDL: 41 mg/dL (ref >39)
LDL Chol Calc (NIH): 125 mg/dL — ABNORMAL HIGH (ref 0–99)
Triglycerides: 208 mg/dL — ABNORMAL HIGH (ref 0–149)
VLDL Cholesterol Cal: 37 mg/dL (ref 5–40)

## 2020-06-19 LAB — CARDIOVASCULAR RISK ASSESSMENT

## 2020-08-07 ENCOUNTER — Inpatient Hospital Stay: Payer: 59 | Attending: Oncology | Admitting: Oncology

## 2020-08-07 ENCOUNTER — Other Ambulatory Visit: Payer: Self-pay

## 2020-08-07 ENCOUNTER — Inpatient Hospital Stay: Payer: 59 | Admitting: Oncology

## 2020-08-07 ENCOUNTER — Encounter: Payer: Self-pay | Admitting: Oncology

## 2020-08-07 VITALS — BP 141/69 | HR 79 | Temp 97.6°F | Resp 18 | Ht 62.0 in | Wt 175.0 lb

## 2020-08-07 DIAGNOSIS — Z79811 Long term (current) use of aromatase inhibitors: Secondary | ICD-10-CM | POA: Insufficient documentation

## 2020-08-07 DIAGNOSIS — Z923 Personal history of irradiation: Secondary | ICD-10-CM | POA: Insufficient documentation

## 2020-08-07 DIAGNOSIS — G459 Transient cerebral ischemic attack, unspecified: Secondary | ICD-10-CM | POA: Diagnosis not present

## 2020-08-07 DIAGNOSIS — Z79899 Other long term (current) drug therapy: Secondary | ICD-10-CM | POA: Insufficient documentation

## 2020-08-07 DIAGNOSIS — C50912 Malignant neoplasm of unspecified site of left female breast: Secondary | ICD-10-CM

## 2020-08-07 DIAGNOSIS — M8588 Other specified disorders of bone density and structure, other site: Secondary | ICD-10-CM | POA: Diagnosis not present

## 2020-08-07 DIAGNOSIS — M8580 Other specified disorders of bone density and structure, unspecified site: Secondary | ICD-10-CM | POA: Insufficient documentation

## 2020-08-07 DIAGNOSIS — Z170 Estrogen receptor positive status [ER+]: Secondary | ICD-10-CM | POA: Insufficient documentation

## 2020-08-07 DIAGNOSIS — Z8673 Personal history of transient ischemic attack (TIA), and cerebral infarction without residual deficits: Secondary | ICD-10-CM | POA: Insufficient documentation

## 2020-08-07 DIAGNOSIS — Z9221 Personal history of antineoplastic chemotherapy: Secondary | ICD-10-CM | POA: Insufficient documentation

## 2020-08-07 DIAGNOSIS — F17210 Nicotine dependence, cigarettes, uncomplicated: Secondary | ICD-10-CM | POA: Insufficient documentation

## 2020-08-07 NOTE — Progress Notes (Signed)
Victoria Parsons  [address] [place],  [place]  [address] [phone]  Clinic Day:  08/07/2020  Referring physician: Marge Duncans, PA-C   CHIEF COMPLAINT:  CC: Stage IIA left breast cancer  Current Treatment:  Hormonal therapy for a total of 5 years; currently on anastrozole 1 mg daily   HISTORY OF PRESENT ILLNESS:  Victoria Parsons is a 59 y.o. female with a history  of stage IIA (T2 N0 M0) hormone receptor positive left breast cancer diagnosed in March 2018.  She was treated with lumpectomy in April.  Pathology report revealed a 25 mm, grade 2, invasive ductal carcinoma with clear margins and 1 sentinel node negative for metastasis.  Estrogen and progesterone receptors were positive with HER 2 Neu negative.  Ki 67 was 15%.  EndoPredict testing  revealed a score of 3.3, which is in the low risk category, and corresponds with a 9.8% risk of recurrence in the next 10 years with hormonal therapy, so chemotherapy was not felt to be beneficial.  She received adjuvant radiation to the left breast completed in August.  Bone density scan as a baseline in June 2018 was normal.  She is postmenopausal having had her last menses at least 10 years ago.  She was placed on anastrozole 1 mg daily at the end of August 2018. Bilateral screening mammogram in March 2019 did not reveal any evidence of malignancy.  She had a TIA on Christmas Eve 2019, and had double vision for weeks afterwards.  She is now on aspirin 81 mg daily in addition to her usual medications.  She underwent a bone density scan back in July 2020 which revealed mild osteopenia of the hip and spine.  T-score of -1.4 of the right femur neck, previously -0.9.  Dual femur total mean is normal at -0.9, previously -0.2.  AP spine measures -1.1, previously -0.8, and is considered osteopenic. She will need to remain on calcium and vitamin D supplement.   INTERVAL HISTORY:  Victoria Parsons is here for routine follow up and  reports tenderness of the left breast from scar tissue.  Otherwise, she denies complaints.  She is currently smoking 1 pack per day, and has smoked for over 40 years.  She is on Wellbutrin 150 BID to try and quit and we discussed smoking cessation again today.  She is scheduled for routine pap smear tomorrow, and consultation for colonoscopy in January.  She will be due for annual mammogram in March.  She undergoes routine blood work with her primary care physician.  Her appetite is good, and she has gained 5 pounds since her last visit.  She denies fever, chills, or other signs of infection.  She denies nausea, vomiting, bowel issues, or abdominal pain.  She denies sore throat, cough, dyspnea or chest pain.  She has received both COVID vaccines as of October 22nd.     REVIEW OF SYSTEMS:  Review of Systems  Musculoskeletal:       Tenderness of the left breast  All other systems reviewed and are negative.    VITALS:  Blood pressure (!) 141/69, pulse 79, temperature 97.6 F (36.4 C), resp. rate 18, height 5\' 2"  (1.575 m), weight 175 lb (79.4 kg), SpO2 98 %.  Wt Readings from Last 3 Encounters:  08/07/20 175 lb (79.4 kg)  12/29/19 161 lb 9.6 oz (73.3 kg)  06/18/20 170 lb 12.8 oz (77.5 kg)    Body mass index is 32.01 kg/m.  Performance status (ECOG):  1 - Symptomatic but completely ambulatory  PHYSICAL EXAM:  Physical Exam Constitutional:      Appearance: Normal appearance. She is normal weight.  HENT:     Head: Normocephalic and atraumatic.  Eyes:     General: No scleral icterus.    Extraocular Movements: Extraocular movements intact.     Conjunctiva/sclera: Conjunctivae normal.     Pupils: Pupils are equal, round, and reactive to light.  Cardiovascular:     Rate and Rhythm: Normal rate and regular rhythm.     Pulses: Normal pulses.     Heart sounds: Normal heart sounds. No murmur heard.  No friction rub. No gallop.   Pulmonary:     Effort: Pulmonary effort is normal.     Breath  sounds: Normal breath sounds.  Chest:     Breasts:        Right: Normal.      Comments: There is a hard nodule at 12 o'clock in the left breast measuring about 1 cm, which remains stable.  Both breasts are without masses. Abdominal:     General: Bowel sounds are normal. There is no distension.     Palpations: Abdomen is soft. There is no mass.     Tenderness: There is no abdominal tenderness.  Musculoskeletal:        General: Normal range of motion.     Cervical back: Normal range of motion and neck supple.     Right lower leg: No edema.     Left lower leg: No edema.  Lymphadenopathy:     Cervical: No cervical adenopathy.  Skin:    General: Skin is warm and dry.  Neurological:     General: No focal deficit present.     Mental Status: She is alert and oriented to person, place, and time. Mental status is at baseline.  Psychiatric:        Mood and Affect: Mood normal.        Behavior: Behavior normal.        Thought Content: Thought content normal.        Judgment: Judgment normal.     LABS:   CBC Latest Ref Rng & Units 06/18/2020 12/15/2019 09/29/2018  WBC 3.4 - 10.8 x10E3/uL 7.4 7.5 -  Hemoglobin 11.1 - 15.9 g/dL 15.5 15.4 15.3(H)  Hematocrit 34.0 - 46.6 % 46.2 46.7(H) 45.0  Platelets 150 - 450 x10E3/uL 285 302 -   CMP Latest Ref Rng & Units 06/18/2020 05/13/2020 04/11/2020  Glucose 65 - 99 mg/dL 91 92 -  BUN 6 - 24 mg/dL 17 20 -  Creatinine 0.57 - 1.00 mg/dL 0.94 0.96 -  Sodium 134 - 144 mmol/L 140 141 -  Potassium 3.5 - 5.2 mmol/L 4.6 4.0 -  Chloride 96 - 106 mmol/L 107(H) 103 -  CO2 20 - 29 mmol/L 22 20 -  Calcium 8.7 - 10.2 mg/dL 10.6(H) 10.6(H) 11.6(H)  Total Protein 6.0 - 8.5 g/dL 6.9 6.7 -  Total Bilirubin 0.0 - 1.2 mg/dL 0.3 0.3 -  Alkaline Phos 44 - 121 IU/L 144(H) 139(H) -  AST 0 - 40 IU/L 24 24 -  ALT 0 - 32 IU/L 28 31 -    STUDIES:  No results found.   Allergies:  Allergies  Allergen Reactions  . Penicillins Other (See Comments)    DID THE REACTION  INVOLVE: Swelling of the face/tongue/throat, SOB, or low BP? Yes Sudden or severe rash/hives, skin peeling, or the inside of the mouth or nose? Yes Did it  require medical treatment? Yes When did it last happen?15-20 YRS AGO If all above answers are "NO", may proceed with cephalosporin use.     Current Medications: Current Outpatient Medications  Medication Sig Dispense Refill  . acetaminophen (TYLENOL) 500 MG tablet Take 1,500 mg by mouth every 6 (six) hours as needed for headache.    . anastrozole (ARIMIDEX) 1 MG tablet Take 1 mg by mouth at bedtime.     Marland Kitchen aspirin EC 81 MG tablet Take 81 mg by mouth daily.    Marland Kitchen atorvastatin (LIPITOR) 10 MG tablet Take 1 tablet (10 mg total) by mouth at bedtime. 90 tablet 1  . buPROPion (WELLBUTRIN SR) 150 MG 12 hr tablet Take 1 tablet (150 mg total) by mouth 2 (two) times daily. (Patient taking differently: Take 150 mg by mouth 2 (two) times daily. Twice a day when she remembers to take morning dose) 180 tablet 1  . loratadine (CLARITIN) 10 MG tablet Take 10 mg by mouth at bedtime.     . Omega-3 300 MG CAPS Take 1 capsule by mouth at bedtime.     No current facility-administered medications for this visit.     ASSESSMENT & PLAN:   Assessment:   1.  History of stage IIA hormone receptor positive breast cancer diagnosed in March 2018.  She remains without evidence of recurrence.  She was placed on anastrozole 1 mg daily in August 2018, and she will continue this for at least a total of 5 years.    2.  Mild osteopenia.  She will be due for repeat bone density in July 2022.  3.  Current daily smoker.  We discussed the importance of smoking cessation again today.  She is currently on Wellbutrin 150 mg BID to try and quit.    4.  History of a TIA in December 2019.     Plan: We will plan to see her back in late March with bilateral mammogram for re-examination.  We may go to yearly follow up after that if she is comfortable.  I did review the  importance of the cessation of smoking, and she is trying to quit.  The patient understands the plans discussed today and is in agreement with them.  She knows to contact our office if she develops concerns regarding her breast cancer or its treatment   I provided 25 minutes of face-to-face time during this this encounter and > 50% was spent counseling as documented under my assessment and plan.    Derwood Kaplan, MD [facility] AT [facility] [address] [place] [address] Dept: [phone] Dept Fax: [phone]   I, Rita Ohara, am acting as scribe for Derwood Kaplan, MD  I have reviewed this report as typed by the medical scribe, and it is complete and accurate.

## 2020-09-12 ENCOUNTER — Other Ambulatory Visit: Payer: Self-pay | Admitting: Physician Assistant

## 2020-09-12 DIAGNOSIS — Z716 Tobacco abuse counseling: Secondary | ICD-10-CM

## 2020-09-12 DIAGNOSIS — E782 Mixed hyperlipidemia: Secondary | ICD-10-CM

## 2020-09-26 ENCOUNTER — Ambulatory Visit: Payer: 59 | Admitting: Physician Assistant

## 2020-10-22 ENCOUNTER — Other Ambulatory Visit: Payer: Self-pay | Admitting: Oncology

## 2020-12-04 ENCOUNTER — Encounter: Payer: Self-pay | Admitting: Physician Assistant

## 2020-12-09 LAB — HM COLONOSCOPY

## 2020-12-19 ENCOUNTER — Encounter: Payer: Self-pay | Admitting: Oncology

## 2020-12-23 LAB — HM MAMMOGRAPHY

## 2020-12-24 ENCOUNTER — Encounter: Payer: Self-pay | Admitting: Physician Assistant

## 2020-12-24 ENCOUNTER — Ambulatory Visit (INDEPENDENT_AMBULATORY_CARE_PROVIDER_SITE_OTHER): Payer: 59 | Admitting: Physician Assistant

## 2020-12-24 ENCOUNTER — Other Ambulatory Visit: Payer: Self-pay | Admitting: Physician Assistant

## 2020-12-24 ENCOUNTER — Other Ambulatory Visit: Payer: Self-pay

## 2020-12-24 VITALS — BP 138/78 | HR 91 | Temp 96.8°F | Ht 62.0 in | Wt 176.4 lb

## 2020-12-24 DIAGNOSIS — Z716 Tobacco abuse counseling: Secondary | ICD-10-CM

## 2020-12-24 DIAGNOSIS — E782 Mixed hyperlipidemia: Secondary | ICD-10-CM

## 2020-12-24 DIAGNOSIS — Z13820 Encounter for screening for osteoporosis: Secondary | ICD-10-CM

## 2020-12-24 DIAGNOSIS — C50912 Malignant neoplasm of unspecified site of left female breast: Secondary | ICD-10-CM

## 2020-12-24 DIAGNOSIS — M25571 Pain in right ankle and joints of right foot: Secondary | ICD-10-CM

## 2020-12-24 MED ORDER — BUPROPION HCL ER (SR) 150 MG PO TB12: 150.0000 mg | ORAL_TABLET | Freq: Two times a day (BID) | ORAL | 1 refills | Status: DC

## 2020-12-24 NOTE — Progress Notes (Signed)
Acute Office Visit  Subjective:    Patient ID: Victoria Parsons, female    DOB: [birth year], 60 y.o.   MRN: [id number]  Chief Complaint  Patient presents with  . Hyperlipidemia    Hyperlipidemia   Patient is in today for follow up hyperlipidemia Mixed hyperlipidemia  Pt presents with hyperlipidemia.Compliance with treatment has been goodThe patient is compliant with medications, maintains a low cholesterol diet , follows up as directed , and maintains an exercise regimen . The patient denies experiencing any hypercholesterolemia related symptoms. Pt currently on lipitor 10mg  qd  Pt is currently taking wellbutrin to try to help her stop smoking - she states she forgets to take second dose a lot of times - still smoking -  Pt with history of breast cancer - she currently follows with oncology every 6 months - Dr Hinton Rao - symptoms have been stable - she is currently taking Arimidex- had a mammogram yesterday  Pt complains of intermittent right ankle pain - says  It is worse at work - denies injury or trauma She does however wear crocs and flip flops all the time when not at work   Past Medical History:  Diagnosis Date  . Cancer ([place])   . Generalized anxiety disorder   . Hypercholesterolemia   . Mixed hyperlipidemia   . Osteopenia 04/19/2019  . Sixth (abducent) nerve palsy, right eye     Past Surgical History:  Procedure Laterality Date  . BREAST SURGERY    . TONSILLECTOMY      Family History  Problem Relation Age of Onset  . Hypertension Mother   . Hyperlipidemia Mother   . Diabetes Mother   . Arthritis Mother   . Alzheimer's disease Mother   . Osteoarthritis Mother   . Hypertension Father   . Hyperlipidemia Father   . Diabetes Father   . Coronary artery disease Father   . Hypothyroidism Sister   . Anxiety disorder Sister   . Depression Sister     Social History   Socioeconomic History  . Marital status: Married    Spouse name: Not on file  . Number of  children: 1  . Years of education: Not on file  . Highest education level: Not on file  Occupational History    Employer: [employer]  Tobacco Use  . Smoking status: Current Every Day Smoker    Types: Cigarettes  . Smokeless tobacco: Never Used  Vaping Use  . Vaping Use: Never used  Substance and Sexual Activity  . Alcohol use: Not Currently  . Drug use: Never  . Sexual activity: Not on file  Other Topics Concern  . Not on file  Social History Narrative  . Not on file   Social Determinants of Health   Financial Resource Strain: Not on file  Food Insecurity: Not on file  Transportation Needs: Not on file  Physical Activity: Not on file  Stress: Not on file  Social Connections: Not on file  Intimate Partner Violence: Not on file     Current Outpatient Medications:  .  acetaminophen (TYLENOL) 500 MG tablet, Take 1,500 mg by mouth every 6 (six) hours as needed for headache., Disp: , Rfl:  .  anastrozole (ARIMIDEX) 1 MG tablet, TAKE 1 TABLET BY MOUTH  DAILY, Disp: 90 tablet, Rfl: 3 .  aspirin EC 81 MG tablet, Take 81 mg by mouth daily., Disp: , Rfl:  .  atorvastatin (LIPITOR) 10 MG tablet, TAKE 1 TABLET BY MOUTH AT  BEDTIME, Disp: 90  tablet, Rfl: 3 .  buPROPion (WELLBUTRIN SR) 150 MG 12 hr tablet, TAKE 1 TABLET BY MOUTH  TWICE DAILY, Disp: 180 tablet, Rfl: 0 .  loratadine (CLARITIN) 10 MG tablet, Take 10 mg by mouth at bedtime. , Disp: , Rfl:  .  Omega-3 300 MG CAPS, Take 1 capsule by mouth at bedtime., Disp: , Rfl:    Allergies  Allergen Reactions  . Penicillins Other (See Comments)    DID THE REACTION INVOLVE: Swelling of the face/tongue/throat, SOB, or low BP? Yes Sudden or severe rash/hives, skin peeling, or the inside of the mouth or nose? Yes Did it require medical treatment? Yes When did it last happen?15-20 YRS AGO If all above answers are "NO", may proceed with cephalosporin use.     CONSTITUTIONAL: Negative for chills, fatigue, fever, unintentional weight  gain and unintentional weight loss.  E/N/T: Negative for ear pain, nasal congestion and sore throat.  CARDIOVASCULAR: Negative for chest pain, dizziness, palpitations and pedal edema.  RESPIRATORY: Negative for recent cough and dyspnea.  GASTROINTESTINAL: Negative for abdominal pain, acid reflux symptoms, constipation, diarrhea, nausea and vomiting.  MSK: see HPI INTEGUMENTARY: Negative for rash.  NEUROLOGICAL: Negative for dizziness and headaches.  PSYCHIATRIC: Negative for sleep disturbance and to question depression screen.  Negative for depression, negative for anhedonia.             Objective:    PHYSICAL EXAM:   VS: BP 138/78 (BP Location: Left Arm, Patient Position: Sitting, Cuff Size: Normal)   Pulse 91   Temp (!) 96.8 F (36 C) (Temporal)   Ht 5\' 2"  (1.575 m)   Wt 176 lb 6.4 oz (80 kg)   SpO2 96%   BMI 32.26 kg/m   PHYSICAL EXAM:   VS: BP 138/78 (BP Location: Left Arm, Patient Position: Sitting, Cuff Size: Normal)   Pulse 91   Temp (!) 96.8 F (36 C) (Temporal)   Ht 5\' 2"  (1.575 m)   Wt 176 lb 6.4 oz (80 kg)   SpO2 96%   BMI 32.26 kg/m   GEN: Well nourished, well developed, in no acute distress  Cardiac: RRR; no murmurs, rubs, or gallops,no edema -  Respiratory:  normal respiratory rate and pattern with no distress - normal breath sounds with no rales, rhonchi, wheezes or rubs GI: normal bowel sounds, no masses or tenderness MS: no deformity or atrophy  Skin: warm and dry, no rash  Psych: euthymic mood, appropriate affect and demeanor   Wt Readings from Last 3 Encounters:  12/24/20 176 lb 6.4 oz (80 kg)  08/07/20 175 lb (79.4 kg)  12/29/19 161 lb 9.6 oz (73.3 kg)    Health Maintenance Due  Topic Date Due  . PAP SMEAR-Modifier  Never done  . COLONOSCOPY (Pts 45-47yrs Insurance coverage will need to be confirmed)  Never done  . COVID-19 Vaccine (2 - Pfizer risk 4-dose series) 08/16/2020  . MAMMOGRAM  12/24/2020    There are no preventive care  reminders to display for this patient.        Assessment & Plan:   Problem List Items Addressed This Visit      Other   Mixed hyperlipidemia - Primary   Relevant Orders   CBC with Differential/Platelet   Comprehensive metabolic panel   Lipid panel Continue meds as directed   Encounter for smoking cessation counseling Continue wellbutrin and efforts at smoking cessation   Infiltrating ductal carcinoma of left breast ([place]) Continue meds and follow up with oncology as directed  Osteoporosis screening   Relevant Orders   DG Bone Density   Acute right ankle pain Recommend otc motrin, ice therapy and change to more supportive shoes for a few weeks - if not helpful consider referral to ortho       No orders of the defined types were placed in this encounter.    SARA R Lashonne Shull, PA-C

## 2020-12-25 ENCOUNTER — Other Ambulatory Visit: Payer: Self-pay | Admitting: Physician Assistant

## 2020-12-25 DIAGNOSIS — R899 Unspecified abnormal finding in specimens from other organs, systems and tissues: Secondary | ICD-10-CM

## 2020-12-25 LAB — COMPREHENSIVE METABOLIC PANEL
ALT: 20 IU/L (ref 0–32)
AST: 19 IU/L (ref 0–40)
Albumin/Globulin Ratio: 2 (ref 1.2–2.2)
Albumin: 4.5 g/dL (ref 3.8–4.9)
Alkaline Phosphatase: 152 IU/L — ABNORMAL HIGH (ref 44–121)
BUN/Creatinine Ratio: 15 (ref 9–23)
BUN: 15 mg/dL (ref 6–24)
Bilirubin Total: 0.4 mg/dL (ref 0.0–1.2)
CO2: 20 mmol/L (ref 20–29)
Calcium: 10.9 mg/dL — ABNORMAL HIGH (ref 8.7–10.2)
Chloride: 106 mmol/L (ref 96–106)
Creatinine, Ser: 1.01 mg/dL — ABNORMAL HIGH (ref 0.57–1.00)
Globulin, Total: 2.2 g/dL (ref 1.5–4.5)
Glucose: 88 mg/dL (ref 65–99)
Potassium: 4.6 mmol/L (ref 3.5–5.2)
Sodium: 142 mmol/L (ref 134–144)
Total Protein: 6.7 g/dL (ref 6.0–8.5)
eGFR: 64 mL/min/{1.73_m2} (ref >59)

## 2020-12-25 LAB — CBC WITH DIFFERENTIAL/PLATELET
Basophils Absolute: 0.1 10*3/uL (ref 0.0–0.2)
Basos: 1 %
EOS (ABSOLUTE): 0.2 10*3/uL (ref 0.0–0.4)
Eos: 2 %
Hematocrit: 47.7 % — ABNORMAL HIGH (ref 34.0–46.6)
Hemoglobin: 15.9 g/dL (ref 11.1–15.9)
Immature Grans (Abs): 0 10*3/uL (ref 0.0–0.1)
Immature Granulocytes: 0 %
Lymphocytes Absolute: 1.7 10*3/uL (ref 0.7–3.1)
Lymphs: 24 %
MCH: 31.4 pg (ref 26.6–33.0)
MCHC: 33.3 g/dL (ref 31.5–35.7)
MCV: 94 fL (ref 79–97)
Monocytes Absolute: 0.5 10*3/uL (ref 0.1–0.9)
Monocytes: 7 %
Neutrophils Absolute: 4.7 10*3/uL (ref 1.4–7.0)
Neutrophils: 66 %
Platelets: 322 10*3/uL (ref 150–450)
RBC: 5.07 x10E6/uL (ref 3.77–5.28)
RDW: 12.9 % (ref 11.7–15.4)
WBC: 7.2 10*3/uL (ref 3.4–10.8)

## 2020-12-25 LAB — CARDIOVASCULAR RISK ASSESSMENT

## 2020-12-25 LAB — LIPID PANEL
Chol/HDL Ratio: 5 ratio — ABNORMAL HIGH (ref 0.0–4.4)
Cholesterol, Total: 191 mg/dL (ref 100–199)
HDL: 38 mg/dL — ABNORMAL LOW (ref >39)
LDL Chol Calc (NIH): 121 mg/dL — ABNORMAL HIGH (ref 0–99)
Triglycerides: 180 mg/dL — ABNORMAL HIGH (ref 0–149)
VLDL Cholesterol Cal: 32 mg/dL (ref 5–40)

## 2020-12-26 ENCOUNTER — Telehealth: Payer: Self-pay

## 2020-12-26 NOTE — Telephone Encounter (Signed)
Patient notified

## 2020-12-26 NOTE — Telephone Encounter (Signed)
-----   Message from Derwood Kaplan, MD sent at 12/26/2020  3:26 PM EDT ----- Regarding: call pt Tell her mammo is clear  (unless you already did)

## 2021-01-06 NOTE — Progress Notes (Signed)
[facility]  [address] [place],  [place]  [address] [phone]  Clinic Day:  01/08/2021  Referring physician: Marge Duncans, PA-C  This document serves as a record of services personally performed by Hosie Poisson, MD. It was created on their behalf by Curry,Lauren E, a trained medical scribe. The creation of this record is based on the scribe's personal observations and the provider's statements to them.  CHIEF COMPLAINT:  CC: Stage IIA left breast cancer  Current Treatment:  Hormonal therapy for a total of 5 years; currently on anastrozole 1 mg daily   HISTORY OF PRESENT ILLNESS:  Victoria Parsons is a 60 y.o. female with a history  of stage IIA (T2 N0 M0) hormone receptor positive left breast cancer diagnosed in March 2018.  She was treated with lumpectomy in April.  Pathology report revealed a 25 mm, grade 2, invasive ductal carcinoma with clear margins and 1 sentinel node negative for metastasis.  Estrogen and progesterone receptors were positive with HER 2 Neu negative.  Ki 67 was 15%.  EndoPredict testing  revealed a score of 3.3, which is in the low risk category, and corresponds with a 9.8% risk of recurrence in the next 10 years with hormonal therapy, so chemotherapy was not felt to be beneficial.  She received adjuvant radiation to the left breast completed in August.  Bone density scan as a baseline in June 2018 was normal.  She is postmenopausal having had her last menses at least 10 years ago.  She was placed on anastrozole 1 mg daily at the end of August 2018. Bilateral screening mammogram in March 2019 did not reveal any evidence of malignancy.  She had a TIA on Christmas Eve 2019, and had double vision for weeks afterwards.  She is now on aspirin 81 mg daily in addition to her usual medications.  She underwent a bone density scan back in July 2020 which revealed mild osteopenia of the hip and spine.  T-score of -1.4 of the right femur  neck, previously -0.9.  Dual femur total mean is normal at -0.9, previously -0.2.  AP spine measures -1.1, previously -0.8, and is considered osteopenic. She will need to remain on calcium and vitamin D supplement.  INTERVAL HISTORY:  Victoria Parsons is here for routine follow up and states that she is well and denies complaints.  Annual bilateral mammogram from March 2022 was clear.  She continues anastrozole 1 mg daily without difficulty.  She continues to have discomfort at her lumpectomy site.  This has been stable and evaluated in the past with ultrasound.  Marge Duncans, PA-C obtains routine lab work.  She is scheduled for bone density in July.  Her  appetite is good, and she has gained nearly 3 pounds since her last visit.  She denies fever, chills or other signs of infection.  She denies nausea, vomiting, bowel issues, or abdominal pain.  She denies sore throat, cough, dyspnea, or chest pain.  REVIEW OF SYSTEMS:  Review of Systems  Constitutional: Negative.  Negative for appetite change, chills, fatigue, fever and unexpected weight change.  HENT:  Negative.   Eyes: Negative.   Respiratory: Negative.  Negative for chest tightness, cough, hemoptysis, shortness of breath and wheezing.   Cardiovascular: Negative.  Negative for chest pain, leg swelling and palpitations.  Gastrointestinal: Negative.  Negative for abdominal distention, abdominal pain, blood in stool, constipation, diarrhea, nausea and vomiting.  Endocrine: Negative.   Genitourinary: Negative.  Negative for difficulty urinating,  dysuria, frequency and hematuria.   Musculoskeletal: Negative.  Negative for arthralgias, back pain, flank pain, gait problem and myalgias.  Skin: Negative.   Neurological: Negative.  Negative for dizziness, extremity weakness, gait problem, headaches, light-headedness, numbness, seizures and speech difficulty.  Hematological: Negative.   Psychiatric/Behavioral: Negative.  Negative for depression and sleep disturbance.  The patient is not nervous/anxious.      VITALS:  Blood pressure (!) 141/78, pulse 74, temperature 97.9 F (36.6 C), temperature source Oral, resp. rate 18, height 5\' 2"  (1.575 m), weight 177 lb 14.4 oz (80.7 kg), SpO2 96 %.  Wt Readings from Last 3 Encounters:  01/08/21 177 lb 14.4 oz (80.7 kg)  12/24/20 176 lb 6.4 oz (80 kg)  08/07/20 175 lb (79.4 kg)    Body mass index is 32.54 kg/m.  Performance status (ECOG): 0 - Asymptomatic  PHYSICAL EXAM:  Physical Exam Constitutional:      General: She is not in acute distress.    Appearance: Normal appearance. She is normal weight.  HENT:     Head: Normocephalic and atraumatic.  Eyes:     General: No scleral icterus.    Extraocular Movements: Extraocular movements intact.     Conjunctiva/sclera: Conjunctivae normal.     Pupils: Pupils are equal, round, and reactive to light.  Cardiovascular:     Rate and Rhythm: Normal rate and regular rhythm.     Pulses: Normal pulses.     Heart sounds: Normal heart sounds. No murmur heard. No friction rub. No gallop.   Pulmonary:     Effort: Pulmonary effort is normal. No respiratory distress.     Breath sounds: Normal breath sounds.  Chest:  Breasts:     Right: Normal. No mass.     Left: Normal. No mass.      Comments: She has a well healed scar along the superior edge of the areolar complex of the left breast and some nodularity above that.  Both breasts are without masses. Abdominal:     General: Bowel sounds are normal. There is no distension.     Palpations: Abdomen is soft. There is no hepatomegaly, splenomegaly or mass.     Tenderness: There is no abdominal tenderness.  Musculoskeletal:        General: Normal range of motion.     Cervical back: Normal range of motion and neck supple.     Right lower leg: No edema.     Left lower leg: No edema.  Lymphadenopathy:     Cervical: No cervical adenopathy.  Skin:    General: Skin is warm and dry.  Neurological:     General: No focal  deficit present.     Mental Status: She is alert and oriented to person, place, and time. Mental status is at baseline.  Psychiatric:        Mood and Affect: Mood normal.        Behavior: Behavior normal.        Thought Content: Thought content normal.        Judgment: Judgment normal.     LABS:   CBC Latest Ref Rng & Units 12/24/2020 06/18/2020 12/15/2019  WBC 3.4 - 10.8 x10E3/uL 7.2 7.4 7.5  Hemoglobin 11.1 - 15.9 g/dL 15.9 15.5 15.4  Hematocrit 34.0 - 46.6 % 47.7(H) 46.2 46.7(H)  Platelets 150 - 450 x10E3/uL 322 285 302   CMP Latest Ref Rng & Units 12/24/2020 06/18/2020 05/13/2020  Glucose 65 - 99 mg/dL 88 91 92  BUN 6 -  24 mg/dL 15 17 20   Creatinine 0.57 - 1.00 mg/dL 1.01(H) 0.94 0.96  Sodium 134 - 144 mmol/L 142 140 141  Potassium 3.5 - 5.2 mmol/L 4.6 4.6 4.0  Chloride 96 - 106 mmol/L 106 107(H) 103  CO2 20 - 29 mmol/L 20 22 20   Calcium 8.7 - 10.2 mg/dL 10.9(H) 10.6(H) 10.6(H)  Total Protein 6.0 - 8.5 g/dL 6.7 6.9 6.7  Total Bilirubin 0.0 - 1.2 mg/dL 0.4 0.3 0.3  Alkaline Phos 44 - 121 IU/L 152(H) 144(H) 139(H)  AST 0 - 40 IU/L 19 24 24   ALT 0 - 32 IU/L 20 28 31     STUDIES:   EXAM: 12/23/2020 DIGITAL DIAGNOSTIC BILATERAL MAMMOGRAM WITH TOMOSYNTHESIS AND CAD  TECHNIQUE: Bilateral digital diagnostic mammography and breast tomosynthesis was performed. The images were evaluated with computer-aided detection.  COMPARISON:  Previous exam(s).  ACR Breast Density Category c: The breast tissue is heterogeneously dense, which may obscure small masses.  FINDINGS: The left breast lumpectomy site is stable. No suspicious calcifications, masses or areas of distortion are seen in the bilateral breasts.  IMPRESSION: Stable left breast lumpectomy site. No mammographic evidence of malignancy in the bilateral breasts.  RECOMMENDATION: Per protocol, as the patient is now 2 or more years status post lumpectomy, she may return to annual screening mammography in 1 year. However,  given the history of breast cancer, the patient remains eligible for annual diagnostic mammography if preferred.  Allergies:  Allergies  Allergen Reactions  . Penicillins Other (See Comments)    DID THE REACTION INVOLVE: Swelling of the face/tongue/throat, SOB, or low BP? Yes Sudden or severe rash/hives, skin peeling, or the inside of the mouth or nose? Yes Did it require medical treatment? Yes When did it last happen?15-20 YRS AGO If all above answers are "NO", may proceed with cephalosporin use.     Current Medications: Current Outpatient Medications  Medication Sig Dispense Refill  . acetaminophen (TYLENOL) 500 MG tablet Take 1,500 mg by mouth every 6 (six) hours as needed for headache.    . anastrozole (ARIMIDEX) 1 MG tablet TAKE 1 TABLET BY MOUTH  DAILY 90 tablet 3  . aspirin EC 81 MG tablet Take 81 mg by mouth daily.    Marland Kitchen atorvastatin (LIPITOR) 10 MG tablet TAKE 1 TABLET BY MOUTH AT  BEDTIME 90 tablet 3  . buPROPion (WELLBUTRIN SR) 150 MG 12 hr tablet Take 1 tablet (150 mg total) by mouth 2 (two) times daily. 180 tablet 1  . loratadine (CLARITIN) 10 MG tablet Take 10 mg by mouth at bedtime.     . Omega-3 300 MG CAPS Take 1 capsule by mouth at bedtime.     No current facility-administered medications for this visit.     ASSESSMENT & PLAN:   Assessment:   1.  History of stage IIA hormone receptor positive breast cancer diagnosed in March 2018.  She remains without evidence of recurrence.  She was placed on anastrozole 1 mg daily in August 2018, and she will continue this for at least a total of 5 years.    2.  Mild osteopenia.  She is scheduled for repeat bone density in July 2022.  3.  Current daily smoker.  We have discussed the importance of smoking cessation.  She is currently on Wellbutrin 150 mg BID to try and quit.    4.  History of a TIA in December 2019.    Plan: She is scheduled for bone density scan in July.  She gets her  labs through [facility] and has  some chronic mild hypercalcemia.  We will plan to see her back in 1 year with bilateral mammogram for re-examination.  The patient understands the plans discussed today and is in agreement with them.  She knows to contact our office if she develops concerns regarding her breast cancer or its treatment   I provided 25 minutes of face-to-face time during this this encounter and > 50% was spent counseling as documented under my assessment and plan.    Derwood Kaplan, MD [facility] AT [facility] [address] [place] [address] Dept: [phone] Dept Fax: [phone]   I, Rita Ohara, am acting as scribe for Derwood Kaplan, MD  I have reviewed this report as typed by the medical scribe, and it is complete and accurate.

## 2021-01-07 ENCOUNTER — Telehealth: Payer: Self-pay

## 2021-01-07 NOTE — Telephone Encounter (Signed)
Dexa Scan scheduled for April 09, 2021 @ 10:30 check-in.  Patient notified of appointment time and no calcium or vitamins 24 hours prior to procedure.

## 2021-01-08 ENCOUNTER — Inpatient Hospital Stay: Payer: 59 | Attending: Oncology | Admitting: Oncology

## 2021-01-08 ENCOUNTER — Other Ambulatory Visit: Payer: Self-pay

## 2021-01-08 ENCOUNTER — Other Ambulatory Visit: Payer: Self-pay | Admitting: Oncology

## 2021-01-08 ENCOUNTER — Encounter: Payer: Self-pay | Admitting: Oncology

## 2021-01-08 ENCOUNTER — Telehealth: Payer: Self-pay | Admitting: Oncology

## 2021-01-08 VITALS — BP 141/78 | HR 74 | Temp 97.9°F | Resp 18 | Ht 62.0 in | Wt 177.9 lb

## 2021-01-08 DIAGNOSIS — C50912 Malignant neoplasm of unspecified site of left female breast: Secondary | ICD-10-CM | POA: Diagnosis not present

## 2021-01-08 DIAGNOSIS — Z1231 Encounter for screening mammogram for malignant neoplasm of breast: Secondary | ICD-10-CM

## 2021-01-08 NOTE — Telephone Encounter (Signed)
Per 4/6 los next appt scheduled and given to patient.Mammogram order faxed to centralized scheduling

## 2021-01-29 ENCOUNTER — Other Ambulatory Visit: Payer: Self-pay

## 2021-01-29 ENCOUNTER — Other Ambulatory Visit: Payer: 59

## 2021-01-29 DIAGNOSIS — R899 Unspecified abnormal finding in specimens from other organs, systems and tissues: Secondary | ICD-10-CM

## 2021-01-30 LAB — PTH, INTACT AND CALCIUM
Calcium: 10.7 mg/dL — ABNORMAL HIGH (ref 8.7–10.2)
PTH: 85 pg/mL — ABNORMAL HIGH (ref 15–65)

## 2021-02-11 ENCOUNTER — Other Ambulatory Visit: Payer: Self-pay

## 2021-02-11 DIAGNOSIS — E210 Primary hyperparathyroidism: Secondary | ICD-10-CM

## 2021-02-11 NOTE — Progress Notes (Signed)
Pt did clarify with new insurance that [facility] is in their network and would like to proceed with referral. Will place referral now.   Royce Macadamia, [place] 02/11/21 11:03 AM

## 2021-03-27 ENCOUNTER — Other Ambulatory Visit: Payer: Self-pay

## 2021-03-27 DIAGNOSIS — Z716 Tobacco abuse counseling: Secondary | ICD-10-CM

## 2021-03-27 DIAGNOSIS — E782 Mixed hyperlipidemia: Secondary | ICD-10-CM

## 2021-03-27 MED ORDER — BUPROPION HCL ER (SR) 150 MG PO TB12: 150.0000 mg | ORAL_TABLET | Freq: Two times a day (BID) | ORAL | 1 refills | Status: DC

## 2021-03-27 MED ORDER — ATORVASTATIN CALCIUM 10 MG PO TABS: 1.0000 | ORAL_TABLET | Freq: Every day | ORAL | 3 refills | Status: DC

## 2021-04-24 ENCOUNTER — Ambulatory Visit: Payer: 59 | Admitting: Endocrinology

## 2021-05-01 ENCOUNTER — Other Ambulatory Visit: Payer: Self-pay

## 2021-05-01 ENCOUNTER — Ambulatory Visit (INDEPENDENT_AMBULATORY_CARE_PROVIDER_SITE_OTHER): Payer: 59 | Admitting: Endocrinology

## 2021-05-01 ENCOUNTER — Encounter: Payer: Self-pay | Admitting: Endocrinology

## 2021-05-01 DIAGNOSIS — R748 Abnormal levels of other serum enzymes: Secondary | ICD-10-CM | POA: Diagnosis not present

## 2021-05-01 LAB — BASIC METABOLIC PANEL
BUN: 16 mg/dL (ref 6–23)
CO2: 26 mEq/L (ref 19–32)
Calcium: 11.3 mg/dL — ABNORMAL HIGH (ref 8.4–10.5)
Chloride: 104 mEq/L (ref 96–112)
Creatinine, Ser: 1.02 mg/dL (ref 0.40–1.20)
GFR: 60.05 mL/min (ref >60.00)
Glucose, Bld: 91 mg/dL (ref 70–99)
Potassium: 4.3 mEq/L (ref 3.5–5.1)
Sodium: 138 mEq/L (ref 135–145)

## 2021-05-01 LAB — VITAMIN D 25 HYDROXY (VIT D DEFICIENCY, FRACTURES): VITD: 30.47 ng/mL (ref 30.00–100.00)

## 2021-05-01 NOTE — Patient Instructions (Addendum)
Blood tests are requested for you today.  We'll let you know about the results.   If these tests confirm the parathyroid to be the cause of the high blood calcium, surgery is not needed now.   Please come back for a follow-up appointment in 1 year.

## 2021-05-01 NOTE — Progress Notes (Signed)
Subjective:    Patient ID: Victoria Parsons, female    DOB: 21-Oct-1960, 60 y.o.   MRN: [id number]  HPI Pt is referred by Victoria Duncans, PA, for hypercalcemia.  Pt was noted to have hypercalcemia in 2021. sjhe has never had urolithiasis, thyroid probs, sarcoidosis, PUD, pancreatitis.  He does not take vitamin-D or A supplements.  Pt denies taking antacids, Li++, or HCTZ.  She was dx'ed with osteopenia in 2022.  She has only a few bony fractures as a child--traumatic.   Past Medical History:  Diagnosis Date   Cancer (Victoria Parsons)    Generalized anxiety disorder    Hypercholesterolemia    Mixed hyperlipidemia    Osteopenia 04/19/2019   Sixth (abducent) nerve palsy, right eye     Past Surgical History:  Procedure Laterality Date   BREAST SURGERY     TONSILLECTOMY      Social History   Socioeconomic History   Marital status: Married    Spouse name: Not on file   Number of children: 1   Years of education: Not on file   Highest education level: Not on file  Occupational History    Employer: Victoria Parsons  Tobacco Use   Smoking status: Every Day    Types: Cigarettes   Smokeless tobacco: Never  Vaping Use   Vaping Use: Never used  Substance and Sexual Activity   Alcohol use: Not Currently   Drug use: Never   Sexual activity: Not on file  Other Topics Concern   Not on file  Social History Narrative   Not on file   Social Determinants of Health   Financial Resource Strain: Not on file  Food Insecurity: Not on file  Transportation Needs: Not on file  Physical Activity: Not on file  Stress: Not on file  Social Connections: Not on file  Intimate Partner Violence: Not on file    Current Outpatient Medications on File Prior to Visit  Medication Sig Dispense Refill   acetaminophen (TYLENOL) 500 MG tablet Take 1,500 mg by mouth every 6 (six) hours as needed for headache.     anastrozole (ARIMIDEX) 1 MG tablet TAKE 1 TABLET BY MOUTH  DAILY 90 tablet 3   aspirin EC 81 MG tablet Take  81 mg by mouth daily.     atorvastatin (LIPITOR) 10 MG tablet Take 1 tablet (10 mg total) by mouth at bedtime. 90 tablet 3   buPROPion (WELLBUTRIN SR) 150 MG 12 hr tablet Take 1 tablet (150 mg total) by mouth 2 (two) times daily. 180 tablet 1   loratadine (CLARITIN) 10 MG tablet Take 10 mg by mouth at bedtime.      Omega-3 300 MG CAPS Take 1 capsule by mouth at bedtime.     No current facility-administered medications on file prior to visit.    Allergies  Allergen Reactions   Penicillins Other (See Comments)    DID THE REACTION INVOLVE: Swelling of the face/tongue/throat, SOB, or low BP? Yes Sudden or severe rash/hives, skin peeling, or the inside of the mouth or nose? Yes Did it require medical treatment? Yes When did it last happen? 15-20 YRS AGO    If all above answers are "NO", may proceed with cephalosporin use.     Family History  Problem Relation Age of Onset   Hypertension Mother    Hyperlipidemia Mother    Diabetes Mother    Arthritis Mother    Alzheimer's disease Mother    Osteoarthritis Mother    Hypertension Father  Hyperlipidemia Father    Diabetes Father    Coronary artery disease Father    Hypothyroidism Sister    Anxiety disorder Sister    Depression Sister    Hyperparathyroidism Neg Hx     BP 130/78 (BP Location: Right Arm, Patient Position: Sitting, Cuff Size: Large)   Pulse 78   Ht '5\' 2"'$  (1.575 m)   Wt 178 lb (80.7 kg)   SpO2 97%   BMI 32.56 kg/m     Review of Systems Denies polyuria, depression, numbness, and back pain.  She has gained a few lbs.       Objective:   Physical Exam VS: see vs page GEN: no distress HEAD: head: no deformity eyes: slight bilat proptosis external nose and ears are normal NECK: supple, thyroid is not enlarged CHEST WALL: no kyphosis.   LUNGS: clear to auscultation CV: reg rate and rhythm, no murmur.  MUSCULOSKELETAL: gait is normal and steady EXTEMITIES: no deformity.  Trace bilat leg edema NEURO:   readily moves all 4's.  sensation is intact to touch on all 4's SKIN:  Normal texture and temperature.  No rash or suspicious lesion is visible.   NODES:  None palpable at the neck.   PSYCH: alert, well-oriented.  Does not appear anxious nor depressed.     Lab Results  Component Value Date   PTH 85 (H) 01/29/2021   PTH Comment 01/29/2021   CALCIUM 10.7 (H) 01/29/2021   CAION 1.32 09/29/2018   Lab Results  Component Value Date   ALT 20 12/24/2020   AST 19 12/24/2020   ALKPHOS 152 (H) 12/24/2020   BILITOT 0.4 12/24/2020     DEXA (2022): worst T-score is -1.6 (spine)  I have reviewed outside records, and summarized: Pt was noted to have hypercalcemia, and referred here.  She takes arimidex for breast ca     Assessment & Plan:  Hypercalcemia, new to me, uncertain etiology and prognosis   Patient Instructions  Blood tests are requested for you today.  We'll let you know about the results.   If these tests confirm the parathyroid to be the cause of the high blood calcium, surgery is not needed now.   Please come back for a follow-up appointment in 1 year.

## 2021-05-06 LAB — ALKALINE PHOSPHATASE, ISOENZYMES
Alkaline Phosphatase: 174 IU/L — ABNORMAL HIGH (ref 44–121)
BONE FRACTION: 50 % (ref 14–68)
INTESTINAL FRAC.: 1 % (ref 0–18)
LIVER FRACTION: 49 % (ref 18–85)

## 2021-05-06 LAB — PTH-RELATED PEPTIDE: PTH-Related Protein (PTH-RP): 8 pg/mL — ABNORMAL LOW (ref 11–20)

## 2021-05-06 LAB — VITAMIN A: Vitamin A (Retinoic Acid): 60 ug/dL (ref 38–98)

## 2021-05-07 ENCOUNTER — Other Ambulatory Visit: Payer: Self-pay | Admitting: Endocrinology

## 2021-05-07 ENCOUNTER — Encounter: Payer: Self-pay | Admitting: Endocrinology

## 2021-05-20 ENCOUNTER — Other Ambulatory Visit: Payer: Self-pay

## 2021-05-26 ENCOUNTER — Other Ambulatory Visit: Payer: Self-pay | Admitting: Endocrinology

## 2021-05-26 ENCOUNTER — Other Ambulatory Visit: Payer: Self-pay

## 2021-06-01 LAB — CALCIUM, URINE, 24 HOUR
Calcium, 24H Urine: 155 mg/24 hr (ref 0–320)
Calcium, Urine: 15.5 mg/dL

## 2021-06-26 ENCOUNTER — Other Ambulatory Visit: Payer: Self-pay

## 2021-06-26 ENCOUNTER — Ambulatory Visit: Payer: 59 | Admitting: Physician Assistant

## 2021-06-26 ENCOUNTER — Encounter: Payer: Self-pay | Admitting: Physician Assistant

## 2021-06-26 VITALS — BP 126/72 | HR 96 | Temp 97.2°F | Ht 62.0 in | Wt 180.0 lb

## 2021-06-26 DIAGNOSIS — E559 Vitamin D deficiency, unspecified: Secondary | ICD-10-CM

## 2021-06-26 DIAGNOSIS — C50912 Malignant neoplasm of unspecified site of left female breast: Secondary | ICD-10-CM | POA: Diagnosis not present

## 2021-06-26 DIAGNOSIS — E782 Mixed hyperlipidemia: Secondary | ICD-10-CM | POA: Diagnosis not present

## 2021-06-26 DIAGNOSIS — Z23 Encounter for immunization: Secondary | ICD-10-CM

## 2021-06-26 NOTE — Progress Notes (Signed)
Acute Office Visit  Subjective:    Patient ID: Victoria Parsons, female    DOB: 1961/02/23, 60 y.o.   MRN: [id number]  Chief Complaint  Patient presents with   Hyperlipidemia    Hyperlipidemia  Patient is in today for follow up hyperlipidemia Mixed hyperlipidemia  Pt presents with hyperlipidemia.Compliance with treatment has been goodThe patient is compliant with medications, maintains a low cholesterol diet , follows up as directed , and maintains an exercise regimen . The patient denies experiencing any hypercholesterolemia related symptoms. Pt currently on lipitor 10mg  qd  Pt is currently taking wellbutrin 150mg  bid - voices no problems with medication - had been taking to help with smoking cessation   Pt with history of breast cancer - she currently follows with oncology every 6 months - Dr Hinton Rao - symptoms have been stable - she is currently taking Arimidex-   Pt with history of hypercalcemia - has seen endocrinologist and currently is not doing further testing - is monitoring levels at this time  Would like flu shot today   Past Medical History:  Diagnosis Date   Cancer ([place])    Generalized anxiety disorder    Hypercholesterolemia    Mixed hyperlipidemia    Osteopenia 04/19/2019   Sixth (abducent) nerve palsy, right eye     Past Surgical History:  Procedure Laterality Date   BREAST SURGERY     TONSILLECTOMY      Family History  Problem Relation Age of Onset   Hypertension Mother    Hyperlipidemia Mother    Diabetes Mother    Arthritis Mother    Alzheimer's disease Mother    Osteoarthritis Mother    Hypertension Father    Hyperlipidemia Father    Diabetes Father    Coronary artery disease Father    Hypothyroidism Sister    Anxiety disorder Sister    Depression Sister    Hyperparathyroidism Neg Hx     Social History   Socioeconomic History   Marital status: Married    Spouse name: Not on file   Number of children: 1   Years of education: Not  on file   Highest education level: Not on file  Occupational History    Employer: [employer]  Tobacco Use   Smoking status: Every Day    Types: Cigarettes   Smokeless tobacco: Never  Vaping Use   Vaping Use: Never used  Substance and Sexual Activity   Alcohol use: Not Currently   Drug use: Never   Sexual activity: Not on file  Other Topics Concern   Not on file  Social History Narrative   Not on file   Social Determinants of Health   Financial Resource Strain: Not on file  Food Insecurity: Not on file  Transportation Needs: Not on file  Physical Activity: Not on file  Stress: Not on file  Social Connections: Not on file  Intimate Partner Violence: Not on file     Current Outpatient Medications:    acetaminophen (TYLENOL) 500 MG tablet, Take 1,500 mg by mouth every 6 (six) hours as needed for headache., Disp: , Rfl:    anastrozole (ARIMIDEX) 1 MG tablet, TAKE 1 TABLET BY MOUTH  DAILY, Disp: 90 tablet, Rfl: 3   aspirin EC 81 MG tablet, Take 81 mg by mouth daily., Disp: , Rfl:    atorvastatin (LIPITOR) 10 MG tablet, Take 1 tablet (10 mg total) by mouth at bedtime., Disp: 90 tablet, Rfl: 3   buPROPion (WELLBUTRIN SR) 150 MG 12  hr tablet, Take 1 tablet (150 mg total) by mouth 2 (two) times daily., Disp: 180 tablet, Rfl: 1   loratadine (CLARITIN) 10 MG tablet, Take 10 mg by mouth at bedtime. , Disp: , Rfl:    Omega-3 300 MG CAPS, Take 1 capsule by mouth at bedtime., Disp: , Rfl:    Allergies  Allergen Reactions   Penicillins Other (See Comments)    DID THE REACTION INVOLVE: Swelling of the face/tongue/throat, SOB, or low BP? Yes Sudden or severe rash/hives, skin peeling, or the inside of the mouth or nose? Yes Did it require medical treatment? Yes When did it last happen? 15-20 YRS AGO    If all above answers are "NO", may proceed with cephalosporin use.    CONSTITUTIONAL: Negative for chills, fatigue, fever, unintentional weight gain and unintentional weight loss.    CARDIOVASCULAR: Negative for chest pain, dizziness, palpitations and pedal edema.  RESPIRATORY: Negative for recent cough and dyspnea.  GASTROINTESTINAL: Negative for abdominal pain, acid reflux symptoms, constipation, diarrhea, nausea and vomiting.  MSK: Negative for arthralgias and myalgias.  INTEGUMENTARY: Negative for rash.  NEUROLOGICAL: Negative for dizziness and headaches.  PSYCHIATRIC: Negative for sleep disturbance and to question depression screen.  Negative for depression, negative for anhedonia.      Objective:    PHYSICAL EXAM:   VS: BP 126/72 (BP Location: Left Arm, Patient Position: Sitting, Cuff Size: Normal)   Pulse 96   Temp (!) 97.2 F (36.2 C) (Temporal)   Ht 5\' 2"  (1.575 m)   Wt 180 lb (81.6 kg)   SpO2 97%   BMI 32.92 kg/m   GEN: Well nourished, well developed, in no acute distress  Cardiac: RRR; no murmurs, rubs, or gallops,no edema -  Respiratory:  normal respiratory rate and pattern with no distress - normal breath sounds with no rales, rhonchi, wheezes or rubs GI: normal bowel sounds, no masses or tenderness Psych: euthymic mood, appropriate affect and demeanor   Wt Readings from Last 3 Encounters:  06/26/21 180 lb (81.6 kg)  05/01/21 178 lb (80.7 kg)  01/08/21 177 lb 14.4 oz (80.7 kg)    Health Maintenance Due  Topic Date Due   PAP SMEAR-Modifier  Never done   COVID-19 Vaccine (3 - Pfizer risk series) 08/23/2020   MAMMOGRAM  12/24/2020    There are no preventive care reminders to display for this patient.        Assessment & Plan:   Problem List Items Addressed This Visit       Other   Mixed hyperlipidemia - Primary   Relevant Orders   CBC with Differential/Platelet   Comprehensive metabolic panel   Lipid panel Continue meds as directed and watch diet    Encounter for smoking cessation counseling Continue wellbutrin and efforts at smoking cessation    Infiltrating ductal carcinoma of left breast ([place]) Continue meds and  follow up with oncology as directed      Hypercalcemia Continue follow up with endocrinologist  Encounter for flu vaccination Flucelvax given              No orders of the defined types were placed in this encounter.    SARA R Genevieve Arbaugh, PA-C

## 2021-06-27 ENCOUNTER — Other Ambulatory Visit: Payer: Self-pay | Admitting: Physician Assistant

## 2021-06-27 DIAGNOSIS — Z716 Tobacco abuse counseling: Secondary | ICD-10-CM

## 2021-06-27 LAB — VITAMIN D 25 HYDROXY (VIT D DEFICIENCY, FRACTURES): Vit D, 25-Hydroxy: 27.7 ng/mL — ABNORMAL LOW (ref 30.0–100.0)

## 2021-06-27 LAB — CBC WITH DIFFERENTIAL/PLATELET
Basophils Absolute: 0.1 10*3/uL (ref 0.0–0.2)
Basos: 1 %
EOS (ABSOLUTE): 0.1 10*3/uL (ref 0.0–0.4)
Eos: 1 %
Hematocrit: 46.9 % — ABNORMAL HIGH (ref 34.0–46.6)
Hemoglobin: 15.8 g/dL (ref 11.1–15.9)
Immature Grans (Abs): 0 10*3/uL (ref 0.0–0.1)
Immature Granulocytes: 0 %
Lymphocytes Absolute: 1.7 10*3/uL (ref 0.7–3.1)
Lymphs: 24 %
MCH: 31.9 pg (ref 26.6–33.0)
MCHC: 33.7 g/dL (ref 31.5–35.7)
MCV: 95 fL (ref 79–97)
Monocytes Absolute: 0.6 10*3/uL (ref 0.1–0.9)
Monocytes: 8 %
Neutrophils Absolute: 4.7 10*3/uL (ref 1.4–7.0)
Neutrophils: 66 %
Platelets: 276 10*3/uL (ref 150–450)
RBC: 4.96 x10E6/uL (ref 3.77–5.28)
RDW: 12.6 % (ref 11.7–15.4)
WBC: 7.1 10*3/uL (ref 3.4–10.8)

## 2021-06-27 LAB — LIPID PANEL
Chol/HDL Ratio: 5.2 ratio — ABNORMAL HIGH (ref 0.0–4.4)
Cholesterol, Total: 204 mg/dL — ABNORMAL HIGH (ref 100–199)
HDL: 39 mg/dL — ABNORMAL LOW (ref >39)
LDL Chol Calc (NIH): 126 mg/dL — ABNORMAL HIGH (ref 0–99)
Triglycerides: 221 mg/dL — ABNORMAL HIGH (ref 0–149)
VLDL Cholesterol Cal: 39 mg/dL (ref 5–40)

## 2021-06-27 LAB — COMPREHENSIVE METABOLIC PANEL
ALT: 24 IU/L (ref 0–32)
AST: 21 IU/L (ref 0–40)
Albumin/Globulin Ratio: 1.9 (ref 1.2–2.2)
Albumin: 4.5 g/dL (ref 3.8–4.9)
Alkaline Phosphatase: 171 IU/L — ABNORMAL HIGH (ref 44–121)
BUN/Creatinine Ratio: 13 (ref 12–28)
BUN: 15 mg/dL (ref 8–27)
Bilirubin Total: 0.3 mg/dL (ref 0.0–1.2)
CO2: 21 mmol/L (ref 20–29)
Calcium: 11.1 mg/dL — ABNORMAL HIGH (ref 8.7–10.3)
Chloride: 105 mmol/L (ref 96–106)
Creatinine, Ser: 1.18 mg/dL — ABNORMAL HIGH (ref 0.57–1.00)
Globulin, Total: 2.4 g/dL (ref 1.5–4.5)
Glucose: 98 mg/dL (ref 65–99)
Potassium: 4.8 mmol/L (ref 3.5–5.2)
Sodium: 142 mmol/L (ref 134–144)
Total Protein: 6.9 g/dL (ref 6.0–8.5)
eGFR: 53 mL/min/{1.73_m2} — ABNORMAL LOW (ref >59)

## 2021-06-27 LAB — TSH: TSH: 1.29 u[IU]/mL (ref 0.450–4.500)

## 2021-06-27 LAB — CARDIOVASCULAR RISK ASSESSMENT

## 2021-06-27 MED ORDER — ATORVASTATIN CALCIUM 20 MG PO TABS: 20.0000 mg | ORAL_TABLET | Freq: Every day | ORAL | 1 refills | Status: DC

## 2021-06-30 ENCOUNTER — Encounter: Payer: Self-pay | Admitting: Physician Assistant

## 2021-08-14 ENCOUNTER — Other Ambulatory Visit: Payer: Self-pay | Admitting: Hematology and Oncology

## 2021-08-14 MED ORDER — ANASTROZOLE 1 MG PO TABS: 1.0000 mg | ORAL_TABLET | Freq: Every day | ORAL | 3 refills | Status: DC

## 2021-09-30 ENCOUNTER — Other Ambulatory Visit: Payer: Self-pay | Admitting: Physician Assistant

## 2021-10-27 ENCOUNTER — Telehealth: Payer: Self-pay | Admitting: *Deleted

## 2021-10-27 NOTE — Telephone Encounter (Signed)
Spoke with the patient and scheduled her for a new patient appt on 1/30 wit Dr Berline Lopes at 10:30 am. Patient aware of the address and phone for the clinic, along with the policy for mask and visitors

## 2021-10-27 NOTE — Telephone Encounter (Signed)
Attempted to reach the patient to schedule a new patient appt with Dr Berline Lopes, [facility] to call the office back to schedule

## 2021-10-29 ENCOUNTER — Encounter: Payer: Self-pay | Admitting: Gynecologic Oncology

## 2021-11-02 NOTE — Progress Notes (Signed)
GYNECOLOGIC ONCOLOGY NEW PATIENT CONSULTATION   Patient Name: Victoria Parsons  Patient Age: 61 y.o. Date of Service: 11/03/21 Referring Provider: Dr. 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  We discussed in detail her long history of cervical dysplasia, which has been driven by HPV.  There is been continued discrepancies between Pap smears and biopsies as well as excisions.  Through all of more recent history, she remains positive for high risk HPV.  We discussed the role that her tobacco use has played in this disease process and I have encouraged her to further decrease or quit smoking.  She is motivated and thinks that her husband will attempt to quit with her this time.  Patient was provided additional tobacco cessation counseling and information.  From a treatment standpoint, in the setting of her high-grade Pap smear with high-grade dysplasia noted on ECC, I agree that there is almost no discernible cervix left other than her os.  I worry that this anatomy will preclude adequate cervical excision procedure.  We discussed that when this is the case, hysterectomy may be performed.  This would offer [place] the ability for evaluation of the entire cervix and to assure that no occult malignancy is missed.  I stressed the importance of continued surveillance even after hysterectomy given the risk of recurrence of HPV related dysplasia at the vaginal cuff.  The patient would like to move forward with definitive surgery with hysterectomy.  Given her desire to decrease further her tobacco use, we will plan to delay surgery until mid March.  We reviewed that not only would decrease tobacco use help with wound healing but would also make the immediate perioperative period  safer from a pulmonary standpoint.  We discussed the plan for robotic assisted hysterectomy, bilateral salpingo-oophorectomy, and any other indicated procedures. The risks of surgery were discussed in detail and she understands these to include infection; wound separation; hernia; vaginal cuff separation, injury to adjacent organs such as bowel, bladder, blood vessels, ureters and nerves; bleeding which may require blood transfusion; anesthesia risk; thromboembolic events; possible death; unforeseen complications; possible need for re-exploration; medical complications such as heart attack, stroke, pleural effusion and pneumonia.  The patient will return closer to the date of her surgery for preoperative visit with [facility].  A copy of this note was sent to the patient's referring provider.   55 minutes of total time was spent for this patient encounter, including preparation, face-to-face counseling with the patient and coordination of care, and documentation of the encounter.   Jeral Pinch, MD  Division of Gynecologic Oncology  Department of Obstetrics and Gynecology  [facility]  ___________________________________________  Chief Complaint: Chief Complaint  Patient presents with   Papanicolaou smear of cervix with high grade squamous intra    History of Present Illness:  Victoria Parsons is a 61 y.o. y.o. female who is seen in consultation at the request of Dr. Royston Sinner for an evaluation of long history of cervical dysplasia, most recent Pap smear showing high-grade dysplasia.  Patient denies any vaginal bleeding or discharge.  She went through menopause at the age of 66.  She has been using vitamin E vaginal suppositories for dryness.  She endorses a good appetite without nausea or emesis.  Reports normal bowel and bladder function.  Dysplasia history  Notes having cryotherapy about 30 years ago for abnormal cells on her cervix. She had normal Pap  smears after. s/p CKC in 2017 after HSIL pap with negative colposcopy. CKC returned negative. Pap in 08/2020: HSIL pap, +HR HPV. Colposcopy in 2021: Biopsies negative CKC in 10/2020. I can't see these records in Care Everywhere, but the patient reports final results were negative for dysplasia. Pap on 09/10/2021: H SIL.  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  ECC shows small fragments of detached squamous mucosa with features consistent with high-grade dysplasia.  Numerous fragments of squamous mucosa with low-grade intraepithelial lesion also noted.  Patient has a history of breast cancer x2 in her left breast.  She had radiation and is now on anastrozole, which she will finish later this year, for total of 5 years.  She lives in [place] with her husband.  She reports tobacco use although is working on quitting.  She is down to 10-12 cigarettes/day.  Is planning to investigate hypnosis.  She is retired.  PAST MEDICAL HISTORY:  Past Medical History:  Diagnosis Date   Breast cancer    on Anastrazole - 5 years will be fall 2023   Generalized anxiety disorder    Hypercalcemia    Hypercholesterolemia    Mixed hyperlipidemia    Osteopenia 04/19/2019   Sixth (abducent) nerve palsy, right eye    Tobacco abuse      PAST SURGICAL HISTORY:  Past Surgical History:  Procedure Laterality Date   BREAST SURGERY     CERVICAL CONE BIOPSY N/A 11/2017   CERVICAL CONE BIOPSY N/A 11/2019   pilonidal cyst N/A    procedure in high school   TONSILLECTOMY      OB/GYN HISTORY:  OB History  Gravida Para Term Preterm AB Living  1 0          SAB IAB Ectopic Multiple Live Births               # Outcome Date GA Lbr Len/2nd Weight Sex Delivery Anes PTL Lv  1 Gravida             No LMP recorded.  Age at menarche: 49 Age at menopause: 39 Hx of HRT: Denies Hx of STDs: HPV Last pap: See HPI History of abnormal pap smears:  Yes  SCREENING STUDIES:  Last mammogram: 2022  Last colonoscopy: 2022 Last bone mineral density: 2022  MEDICATIONS: Outpatient Encounter Medications as of 11/03/2021  Medication Sig   acetaminophen (TYLENOL) 500 MG tablet Take 1,500 mg by mouth every 6 (six) hours as needed for headache.   anastrozole (ARIMIDEX) 1 MG tablet Take 1 tablet (1 mg total) by mouth daily.   aspirin EC 81 MG tablet Take 81 mg by mouth daily.   atorvastatin (LIPITOR) 20 MG tablet TAKE ONE (1) TABLET BY MOUTH EVERY DAY   buPROPion (WELLBUTRIN SR) 150 MG 12 hr tablet TAKE 1 TABLET BY  MOUTH TWICE DAILY   co-enzyme Q-10 30 MG capsule Take 100 mg by mouth daily.   loratadine (CLARITIN) 10 MG tablet Take 10 mg by mouth at bedtime.    Omega-3 300 MG CAPS Take 1 capsule by mouth at bedtime.   No facility-administered encounter medications on file as of 11/03/2021.    ALLERGIES:  Allergies  Allergen Reactions   Penicillins Other (See Comments)    DID THE REACTION INVOLVE: Swelling of the face/tongue/throat, SOB, or low BP? Yes Sudden or severe  rash/hives, skin peeling, or the inside of the mouth or nose? Yes Did it require medical treatment? Yes When did it last happen? 15-20 YRS AGO    If all above answers are "NO", may proceed with cephalosporin use.      FAMILY HISTORY:  Family History  Problem Relation Age of Onset   Hypertension Mother    Hyperlipidemia Mother    Diabetes Mother    Arthritis Mother    Alzheimer's disease Mother    Osteoarthritis Mother    Hypertension Father    Hyperlipidemia Father    Diabetes Father    Coronary artery disease Father    Hypothyroidism Sister    Anxiety disorder Sister    Depression Sister    Breast cancer Paternal Aunt    Breast cancer Maternal Grandmother    Breast cancer Cousin    Hyperparathyroidism Neg Hx    Pancreatic cancer Neg Hx    Prostate cancer Neg Hx    Colon cancer Neg Hx    Ovarian cancer Neg Hx    Endometrial cancer Neg Hx      SOCIAL  HISTORY:  Social Connections: Not on file    REVIEW OF SYSTEMS:  + Dyspareunia Denies appetite changes, fevers, chills, fatigue, unexplained weight changes. Denies hearing loss, neck lumps or masses, mouth sores, ringing in ears or voice changes. Denies cough or wheezing.  Denies shortness of breath. Denies chest pain or palpitations. Denies leg swelling. Denies abdominal distention, pain, blood in stools, constipation, diarrhea, nausea, vomiting, or early satiety. Denies dysuria, frequency, hematuria or incontinence. Denies hot flashes, pelvic pain, vaginal bleeding or vaginal discharge.   Denies joint pain, back pain or muscle pain/cramps. Denies itching, rash, or wounds. Denies dizziness, headaches, numbness or seizures. Denies swollen lymph nodes or glands, denies easy bruising or bleeding. Denies anxiety, depression, confusion, or decreased concentration.  Physical Exam:  Vital Signs for this encounter:  Blood pressure (!) 150/88, pulse 94, temperature 97.9 F (36.6 C), temperature source Oral, resp. rate 18, height 5\' 2"  (1.575 m), weight 173 lb 3.2 oz (78.6 kg), SpO2 97 %. Body mass index is 31.68 kg/m. General: Alert, oriented, no acute distress.  HEENT: Normocephalic, atraumatic. Sclera anicteric.  Chest: Clear to auscultation bilaterally. No wheezes, rhonchi, or rales. Cardiovascular: Regular rate and rhythm, no murmurs, rubs, or gallops.  Abdomen: Normoactive bowel sounds. Soft, nondistended, nontender to palpation. No masses or hepatosplenomegaly appreciated. No palpable fluid wave.  Extremities: Grossly normal range of motion. Warm, well perfused. No edema bilaterally.  Skin: No rashes or lesions.  Lymphatics: No cervical, supraclavicular, or inguinal adenopathy.  GU:  Normal external female genitalia. No lesions. No discharge or bleeding.             Bladder/urethra:  No lesions or masses, well supported bladder             Vagina: Mildly atrophic, no lesions or  masses.             Cervix/uterus: There is a cervical os that is visible, otherwise the cervix is completely flush with the vagina and difficult to discern where cervix ends and vagina starts.  On bimanual exam, there is no definite cervix that can be palpated.  No nodularity or firmness.  Uterus is small and mobile.             Adnexa: No masses appreciated.  Rectal: Confirms above findings.  LABORATORY AND RADIOLOGIC DATA:  Outside medical records were reviewed to synthesize the above history,  along with the history and physical obtained during the visit.   Lab Results  Component Value Date   WBC 7.1 06/26/2021   HGB 15.8 06/26/2021   HCT 46.9 (H) 06/26/2021   PLT 276 06/26/2021   GLUCOSE 98 06/26/2021   CHOL 204 (H) 06/26/2021   TRIG 221 (H) 06/26/2021   HDL 39 (L) 06/26/2021   LDLCALC 126 (H) 06/26/2021   ALT 24 06/26/2021   AST 21 06/26/2021   NA 142 06/26/2021   K 4.8 06/26/2021   CL 105 06/26/2021   CREATININE 1.18 (H) 06/26/2021   BUN 15 06/26/2021   CO2 21 06/26/2021   TSH 1.290 06/26/2021   INR 1.02 09/29/2018

## 2021-11-03 ENCOUNTER — Other Ambulatory Visit: Payer: Self-pay

## 2021-11-03 ENCOUNTER — Inpatient Hospital Stay: Payer: Managed Care, Other (non HMO) | Attending: Gynecologic Oncology | Admitting: Gynecologic Oncology

## 2021-11-03 ENCOUNTER — Encounter: Payer: Self-pay | Admitting: Gynecologic Oncology

## 2021-11-03 VITALS — BP 150/88 | HR 94 | Temp 97.9°F | Resp 18 | Ht 62.0 in | Wt 173.2 lb

## 2021-11-03 DIAGNOSIS — E782 Mixed hyperlipidemia: Secondary | ICD-10-CM | POA: Diagnosis not present

## 2021-11-03 DIAGNOSIS — Z79899 Other long term (current) drug therapy: Secondary | ICD-10-CM | POA: Insufficient documentation

## 2021-11-03 DIAGNOSIS — M8580 Other specified disorders of bone density and structure, unspecified site: Secondary | ICD-10-CM | POA: Insufficient documentation

## 2021-11-03 DIAGNOSIS — Z87410 Personal history of cervical dysplasia: Secondary | ICD-10-CM | POA: Diagnosis not present

## 2021-11-03 DIAGNOSIS — F17210 Nicotine dependence, cigarettes, uncomplicated: Secondary | ICD-10-CM | POA: Insufficient documentation

## 2021-11-03 DIAGNOSIS — R87613 High grade squamous intraepithelial lesion on cytologic smear of cervix (HGSIL): Secondary | ICD-10-CM | POA: Diagnosis present

## 2021-11-03 DIAGNOSIS — F411 Generalized anxiety disorder: Secondary | ICD-10-CM | POA: Diagnosis not present

## 2021-11-03 DIAGNOSIS — R87810 Cervical high risk human papillomavirus (HPV) DNA test positive: Secondary | ICD-10-CM | POA: Diagnosis present

## 2021-11-03 DIAGNOSIS — Z7982 Long term (current) use of aspirin: Secondary | ICD-10-CM | POA: Insufficient documentation

## 2021-11-03 DIAGNOSIS — Z853 Personal history of malignant neoplasm of breast: Secondary | ICD-10-CM | POA: Diagnosis not present

## 2021-11-03 DIAGNOSIS — Z720 Tobacco use: Secondary | ICD-10-CM

## 2021-11-03 NOTE — Patient Instructions (Addendum)
It was a pleasure to meet you today.  As we discussed, in the setting of your high-grade precancer of the cervix, I am recommending treatment to prevent progression to cancer.  Because of your multiple procedures on the cervix, I do not think that additional removal of tissue from the cervix is safe or possible.  Thus, I am recommending that we proceed with hysterectomy.  We will have you back to the office closer to the surgery date for a preop appointment with Joylene John NP. We can hold a tentative date of March 7 for surgery.   Continue efforts for smoking cessation.

## 2021-11-04 ENCOUNTER — Telehealth: Payer: Self-pay | Admitting: *Deleted

## 2021-11-04 NOTE — Telephone Encounter (Signed)
Rescheduled patient's hospital pre op and Melissa pre op appts to the same day

## 2021-11-05 ENCOUNTER — Encounter: Payer: Self-pay | Admitting: Gynecologic Oncology

## 2021-11-05 ENCOUNTER — Other Ambulatory Visit: Payer: Self-pay | Admitting: Gynecologic Oncology

## 2021-11-05 DIAGNOSIS — R87613 High grade squamous intraepithelial lesion on cytologic smear of cervix (HGSIL): Secondary | ICD-10-CM

## 2021-11-26 ENCOUNTER — Encounter: Payer: Self-pay | Admitting: Gynecologic Oncology

## 2021-11-27 NOTE — Patient Instructions (Addendum)
DUE TO COVID-19 ONLY ONE VISITOR IS ALLOWED TO COME WITH YOU AND STAY IN THE WAITING ROOM ONLY DURING PRE OP AND PROCEDURE DAY OF SURGERY.   Up to two visitors ages 16+ are allowed at one time in a patient's room.  The visitors may rotate out with other people throughout the day.  Additionally, up to two children between the ages of 65 and 51 are allowed and do not count toward the number of allowed visitors.  Children within this age range must be accompanied by an adult visitor.  One adult visitor may remain with the patient overnight and must be in the room by 8 PM.            Your procedure is scheduled on: 12/09/21   Report to [facility] Main  Entrance  Report to short stay at 5:15 AM     Call this number if you have problems the morning of surgery [phone]   Start a light diet the day before surgery  Light Diet- Full liquid diet  Strained creamy soups Tea, Coffee- with cream or mild and sugar or honey  Juices- cranberry , grape and apple  Jello  Milkshakes  Pudding , custards  Popsicles  Water Plain ice cream f, frozen yogurt, sherbet, plain yogurt  Fruit ices and popsicles with no fruit pulp  Sugar, honey and syrups Clear broths  Boost, Ensure, Resource and other liquid supplements NO CARBONATED BEVERAGES    No food after midnight.    You may have clear liquid until 4:30 AM.    CLEAR LIQUID DIET  Foods Allowed                                                                     Exclude  Water, Black Coffee, and tea, regular and decaf             ALL SOLID FOODS! Plain Jell-O in any flavor  (No red)                                     Fruit ices (not with fruit pulp)                                            MILK, SOUPS, Iced Popsicles (No red)                                                                            Apple juices  ORANGE JUICE Sports drinks like Gatorade (No red)                                   Lightly seasoned clear broth or consume(fat free) Sugar/Sweetner    At 4:15 AM drink pre surgery drink.(Ensure)   Nothing by mouth after 4:30 AM.    BRUSH YOUR TEETH MORNING OF SURGERY AND RINSE YOUR MOUTH OUT, NO CHEWING GUM CANDY OR MINTS.     Take these medicines the morning of surgery with A SIP OF WATER: Wellbutrin, Arimidex                                You may not have any metal on your body including hair pins and piercings .             Do not wear jewelry,  lotions, powders, deodorant or perfume/cologne.             Do not wear make up .             Do not wear nail polish or nail product on your fingernails or toenails .               Do not shave legs or under arms 48 hours prior to surgery.              Men may shave face and neck.   Do not bring valuables to the hospital. [place].   Contacts, dentures or bridgework may not be worn into surgery.  You may bring a small overnight bag with you     Patients discharged the day of surgery will not be allowed to drive home.   IF YOU ARE HAVING SURGERY AND GOING HOME THE SAME DAY, YOU MUST HAVE AN ADULT TO DRIVE YOU HOME AND BE WITH YOU FOR 24 HOURS.  YOU MAY GO HOME BY TAXI OR UBER OR ORTHERWISE, BUT AN ADULT MUST ACCOMPANY YOU HOME AND STAY WITH YOU FOR 24 HOURS.   _____________________________________________________________________             [facility] - Preparing for Surgery Before surgery, you can play an important role.  Because skin is not sterile, your skin needs to be as free of germs as possible.  You can reduce the number of germs on your skin by washing with CHG (chlorahexidine gluconate) soap before surgery.  CHG is an antiseptic cleaner which kills germs and bonds with the skin to continue killing germs even after washing. Please DO NOT use if you have an allergy to CHG or antibacterial soaps.   If your skin becomes  reddened/irritated stop using . You may use Dial soap or any antibacterial soap instead.  Women :Do not shave (including legs and underarms) for at least 48 hours prior to the first CHG shower.   Men: You may shave your face/neck.  Please follow these instructions carefully:  1.  Shower with CHG Soap the night before surgery and the  morning of Surgery.  2.  If you choose to wash your hair, wash your hair first as usual with your  normal  shampoo.  3.  After you shampoo, rinse your hair and body thoroughly to remove the  shampoo.  4.  Use CHG as you would any other liquid soap.  You can apply chg directly  to the skin and wash                       Gently with a scrungie or clean washcloth.  5.  Apply the CHG Soap to your body ONLY FROM THE NECK DOWN.   Do not use on face/ open                           Wound or open sores. Avoid contact with eyes, ears mouth and genitals (private parts).                       Wash face,  Genitals (private parts) with your normal soap.             6.  Wash thoroughly, paying special attention to the area where your surgery  will be performed.  7.  Thoroughly rinse your body with warm water from the neck down.  8.  DO NOT shower/wash with your normal soap after using and rinsing off  the CHG Soap.             9.  Pat yourself dry with a clean towel.            10.  Wear clean pajamas.            11.  Place clean sheets on your bed the night of your first shower and do not  sleep with pets.  Day of Surgery : Do not apply any lotions/deodorants the morning of surgery.  Please wear clean clothes to the hospital/surgery center.  FAILURE TO FOLLOW THESE INSTRUCTIONS MAY RESULT IN THE CANCELLATION OF YOUR SURGERY   PATIENT SIGNATURE_________________________________    ________________________________________________________________________

## 2021-11-28 ENCOUNTER — Other Ambulatory Visit: Payer: Self-pay

## 2021-11-28 ENCOUNTER — Encounter (HOSPITAL_COMMUNITY): Payer: Self-pay

## 2021-11-28 ENCOUNTER — Inpatient Hospital Stay: Payer: Managed Care, Other (non HMO) | Attending: Gynecologic Oncology | Admitting: Gynecologic Oncology

## 2021-11-28 ENCOUNTER — Encounter (HOSPITAL_COMMUNITY)
Admission: RE | Admit: 2021-11-28 | Discharge: 2021-11-28 | Disposition: A | Discharge status: Home / Self Care | Payer: Managed Care, Other (non HMO) | Source: Ambulatory Visit | Attending: Gynecologic Oncology | Admitting: Gynecologic Oncology

## 2021-11-28 ENCOUNTER — Telehealth: Payer: Self-pay

## 2021-11-28 DIAGNOSIS — R87613 High grade squamous intraepithelial lesion on cytologic smear of cervix (HGSIL): Secondary | ICD-10-CM

## 2021-11-28 DIAGNOSIS — Z01812 Encounter for preprocedural laboratory examination: Secondary | ICD-10-CM | POA: Diagnosis not present

## 2021-11-28 LAB — CBC
HCT: 45.8 % (ref 36.0–46.0)
Hemoglobin: 15.2 g/dL — ABNORMAL HIGH (ref 12.0–15.0)
MCH: 31.7 pg (ref 26.0–34.0)
MCHC: 33.2 g/dL (ref 30.0–36.0)
MCV: 95.6 fL (ref 80.0–100.0)
Platelets: 257 10*3/uL (ref 150–400)
RBC: 4.79 MIL/uL (ref 3.87–5.11)
RDW: 12 % (ref 11.5–15.5)
WBC: 7.9 10*3/uL (ref 4.0–10.5)
nRBC: 0 % (ref 0.0–0.2)

## 2021-11-28 LAB — TYPE AND SCREEN
ABO/RH(D): A NEG
Antibody Screen: NEGATIVE

## 2021-11-28 LAB — COMPREHENSIVE METABOLIC PANEL
ALT: 37 U/L (ref 0–44)
AST: 25 U/L (ref 15–41)
Albumin: 4.3 g/dL (ref 3.5–5.0)
Alkaline Phosphatase: 137 U/L — ABNORMAL HIGH (ref 38–126)
Anion gap: 9 (ref 5–15)
BUN: 19 mg/dL (ref 6–20)
CO2: 22 mmol/L (ref 22–32)
Calcium: 10.4 mg/dL — ABNORMAL HIGH (ref 8.9–10.3)
Chloride: 106 mmol/L (ref 98–111)
Creatinine, Ser: 0.99 mg/dL (ref 0.44–1.00)
GFR, Estimated: >60 mL/min (ref >60)
Glucose, Bld: 84 mg/dL (ref 70–99)
Potassium: 4.1 mmol/L (ref 3.5–5.1)
Sodium: 137 mmol/L (ref 135–145)
Total Bilirubin: 0.6 mg/dL (ref 0.3–1.2)
Total Protein: 7.2 g/dL (ref 6.5–8.1)

## 2021-11-28 MED ORDER — TRAMADOL HCL 50 MG PO TABS: 50.0000 mg | ORAL_TABLET | Freq: Four times a day (QID) | ORAL | 0 refills | Status: DC | PRN

## 2021-11-28 MED ORDER — SENNOSIDES-DOCUSATE SODIUM 8.6-50 MG PO TABS: 2.0000 | ORAL_TABLET | Freq: Every day | ORAL | 0 refills | Status: DC

## 2021-11-28 NOTE — Telephone Encounter (Signed)
Dr. Hinton Rao stated that Victoria Parsons can hold her Arimidex 1 week prior and 1 week post surgery on 12-09-21. Gave this information to Joylene John, NP.

## 2021-11-28 NOTE — Progress Notes (Signed)
Patient here for a pre-operative appointment prior to her scheduled surgery on December 09, 2021. She is scheduled for robotic assisted total laparoscopic hysterectomy (removal of the uterus and cervix), bilateral salpingo-oophorectomy (removal of both ovaries and fallopian tubes). She had her pre-admission testing appointment prior to this appointment at [facility].  The surgery was discussed in detail.  See after visit summary for additional details. Visual aids used to discuss items related to surgery including the incentive spirometer, sequential compression stockings, foley catheter, IV pump, multi-modal pain regimen including tylenol, photo of the surgical robot, female reproductive system to discuss surgery in detail. IS given.     Discussed post-op pain management in detail including the aspects of the enhanced recovery pathway.  Advised her that a new prescription would be sent in for tramadol and it is only to be used for after her upcoming surgery.  We discussed the use of tylenol post-op and to monitor for a maximum of 4,000 mg in a 24 hour period.  Also prescribed sennakot to be used after surgery and to hold if having loose stools.  Discussed bowel regimen in detail.     Discussed the use of heparin pre-op, SCDs, and measures to take at home to prevent DVT including frequent mobility.  Reportable signs and symptoms of DVT discussed. Post-operative instructions discussed and expectations for after surgery. Incisional care discussed as well including reportable signs and symptoms including erythema, drainage, wound separation.     10 minutes spent with the patient.  Verbalizing understanding of material discussed. No needs or concerns voiced at the end of the visit.   Advised patient to call for any needs.  Advised that her post-operative medications had been prescribed and could be picked up at any time. All questions answered. She is congratulated on smoking cessation and she has used hypnosis to  achieve this. She will be contacted with the results of her lab work from today.   This appointment is included in the global surgical bundle as pre-operative teaching and has no charge.

## 2021-11-28 NOTE — Progress Notes (Signed)
COVID test-NA   Bowel prep reminder:Pt will ask  PCP - Marge Duncans PA Cardiologist - none  Chest x-ray - no EKG - no Stress Test - no ECHO - no Cardiac Cath - no Pacemaker/ICD device last checked:NA  Sleep Study - no CPAP -   Fasting Blood Sugar - NA Checks Blood Sugar _____ times a day  Blood Thinner Instructions:ASA 81/ Dr. Rosana Hoes Aspirin Instructions:none. Pt will ask Dr. Berline Lopes when she sees her today Last Dose:  Anesthesia review: yes  Patient denies shortness of breath, fever, cough and chest pain at PAT appointment Pt works out regularly. She quit smoking 11/17/21. She had a TIA in 2019 that caused double vision for a few weeks that resolved on it's own. She takes an ASA and will ask Dr. Berline Lopes about continuing it.  Patient verbalized understanding of instructions that were given to them at the PAT appointment. Patient was also instructed that they will need to review over the PAT instructions again at home before surgery. yes

## 2021-11-28 NOTE — Patient Instructions (Addendum)
Preparing for your Surgery  Plan for surgery on December 09, 2021 with Dr. Jeral Pinch at [place] will be scheduled for robotic assisted total laparoscopic hysterectomy (removal of the uterus and cervix), bilateral salpingo-oophorectomy (removal of both ovaries and fallopian tubes), and any other indicated procedures.  Dr. Hinton Rao is recommending you hold your Arimidex one week before and after surgery.  Pre-operative Testing - (DONE) You will receive a phone call from presurgical testing at [facility] to arrange for a pre-operative appointment and lab work.  -Bring your insurance card, copy of an advanced directive if applicable, medication list  -At that visit, you will be asked to sign a consent for a possible blood transfusion in case a transfusion becomes necessary during surgery.  The need for a blood transfusion is rare but having consent is a necessary part of your care.     -You are fine to keep taking your baby aspirin (81mg ) with your last dose being the evening of March 5.  -Do not take supplements such as fish oil (omega 3), red yeast rice, turmeric before your surgery. You want to avoid medications with aspirin in them including headache powders such as BC or Goody's), Excedrin migraine.  Day Before Surgery at [place] will be asked to take in a light diet the day before surgery. You will be advised you can have clear liquids up until 3 hours before your surgery.    Eat a light diet the day before surgery.  Examples including soups, broths, toast, yogurt, mashed potatoes.  AVOID GAS PRODUCING FOODS. Things to avoid include carbonated beverages (fizzy beverages, sodas), raw fruits and raw vegetables (uncooked), or beans.   If your bowels are filled with gas, your surgeon will have difficulty visualizing your pelvic organs which increases your surgical risks.  Your role in recovery Your role is to become active as soon as directed by your doctor,  while still giving yourself time to heal.  Rest when you feel tired. You will be asked to do the following in order to speed your recovery:  - Cough and breathe deeply. This helps to clear and expand your lungs and can prevent pneumonia after surgery.  - [place]. Do mild physical activity. Walking or moving your legs help your circulation and body functions return to normal. Do not try to get up or walk alone the first time after surgery.   -If you develop swelling on one leg or the other, pain in the back of your leg, redness/warmth in one of your legs, please call the office or go to the Emergency Room to have a doppler to rule out a blood clot. For shortness of breath, chest pain-seek care in the Emergency Room as soon as possible. - Actively manage your pain. Managing your pain lets you move in comfort. We will ask you to rate your pain on a scale of zero to 10. It is your responsibility to tell your doctor or nurse where and how much you hurt so your pain can be treated.  Special Considerations -If you are diabetic, you may be placed on insulin after surgery to have closer control over your blood sugars to promote healing and recovery.  This does not mean that you will be discharged on insulin.  If applicable, your oral antidiabetics will be resumed when you are tolerating a solid diet.  -Your final pathology results from surgery should be available around one week after surgery and  the results will be relayed to you when available.  -Dr. Lahoma Crocker is the surgeon that assists your GYN Oncologist with surgery.  If you end up staying the night, the next day after your surgery you will either see Dr. Berline Lopes or Dr. Lahoma Crocker.  -FMLA forms can be faxed to [phone] and please allow 5-7 business days for completion.  Pain Management After Surgery -You have been prescribed your pain medication and bowel regimen medications before surgery so that you can have  these available when you are discharged from the hospital. The pain medication is for use ONLY AFTER surgery and a new prescription will not be given.   -Make sure that you have Tylenol and Ibuprofen (200-400 mg) at home to use on a regular basis after surgery for pain control. We recommend alternating the medications every hour to six hours since they work differently and are processed in the body differently for pain relief.  -Review the attached handout on narcotic use and their risks and side effects.   Bowel Regimen -You have been prescribed Sennakot-S to take nightly to prevent constipation especially if you are taking the narcotic pain medication intermittently.  It is important to prevent constipation and drink adequate amounts of liquids. You can stop taking this medication when you are not taking pain medication and you are back on your normal bowel routine.  Risks of Surgery Risks of surgery are low but include bleeding, infection, damage to surrounding structures, re-operation, blood clots, and very rarely death.   Blood Transfusion Information (For the consent to be signed before surgery)  We will be checking your blood type before surgery so in case of emergencies, we will know what type of blood you would need.                                            WHAT IS A BLOOD TRANSFUSION?  A transfusion is the replacement of blood or some of its parts. Blood is made up of multiple cells which provide different functions. Red blood cells carry oxygen and are used for blood loss replacement. White blood cells fight against infection. Platelets control bleeding. Plasma helps clot blood. Other blood products are available for specialized needs, such as hemophilia or other clotting disorders. BEFORE THE TRANSFUSION  Who gives blood for transfusions?  You may be able to donate blood to be used at a later date on yourself (autologous donation). Relatives can be asked to donate blood.  This is generally not any safer than if you have received blood from a stranger. The same precautions are taken to ensure safety when a relative's blood is donated. Healthy volunteers who are fully evaluated to make sure their blood is safe. This is blood bank blood. Transfusion therapy is the safest it has ever been in the practice of medicine. Before blood is taken from a donor, a complete history is taken to make sure that person has no history of diseases nor engages in risky social behavior (examples are intravenous drug use or sexual activity with multiple partners). The donor's travel history is screened to minimize risk of transmitting infections, such as malaria. The donated blood is tested for signs of infectious diseases, such as HIV and hepatitis. The blood is then tested to be sure it is compatible with you in order to minimize the chance of a transfusion reaction. If  you or a relative donates blood, this is often done in anticipation of surgery and is not appropriate for emergency situations. It takes many days to process the donated blood. RISKS AND COMPLICATIONS Although transfusion therapy is very safe and saves many lives, the main dangers of transfusion include:  Getting an infectious disease. Developing a transfusion reaction. This is an allergic reaction to something in the blood you were given. Every precaution is taken to prevent this. The decision to have a blood transfusion has been considered carefully by your caregiver before blood is given. Blood is not given unless the benefits outweigh the risks.  AFTER SURGERY INSTRUCTIONS  Return to work: 4-6 weeks if applicable  Activity: 1. Be up and out of the bed during the day.  Take a nap if needed.  You may walk up steps but be careful and use the hand rail.  Stair climbing will tire you more than you think, you may need to stop part way and rest.   2. No lifting or straining for 6 weeks over 10 pounds. No pushing, pulling,  straining for 6 weeks.  3. No driving for around 1 week(s).  Do not drive if you are taking narcotic pain medicine and make sure that your reaction time has returned.   4. You can shower as soon as the next day after surgery. Shower daily.  Use your regular soap and water (not directly on the incision) and pat your incision(s) dry afterwards; don't rub.  No tub baths or submerging your body in water until cleared by your surgeon. If you have the soap that was given to you by pre-surgical testing that was used before surgery, you do not need to use it afterwards because this can irritate your incisions.   5. No sexual activity and nothing in the vagina for 8 weeks.  6. You may experience a small amount of clear drainage from your incisions, which is normal.  If the drainage persists, increases, or changes color please call the office.  7. Do not use creams, lotions, or ointments such as neosporin on your incisions after surgery until advised by your surgeon because they can cause removal of the dermabond glue on your incisions.    8. You may experience vaginal spotting after surgery or around the 6-8 week mark from surgery when the stitches at the top of the vagina begin to dissolve.  The spotting is normal but if you experience heavy bleeding, call our office.  9. Take Tylenol or ibuprofen first for pain and only use narcotic pain medication for severe pain not relieved by the Tylenol or Ibuprofen.  Monitor your Tylenol intake to a max of 4,000 mg in a 24 hour period. You can alternate these medications after surgery.  Diet: 1. Low sodium Heart Healthy Diet is recommended but you are cleared to resume your normal (before surgery) diet after your procedure.  2. It is safe to use a laxative, such as Miralax or Colace, if you have difficulty moving your bowels. You have been prescribed Sennakot-S to take at bedtime every evening after surgery to keep bowel movements regular and to prevent  constipation.    Wound Care: 1. Keep clean and dry.  Shower daily.  Reasons to call the Doctor: Fever - Oral temperature greater than 100.4 degrees Fahrenheit Foul-smelling vaginal discharge Difficulty urinating Nausea and vomiting Increased pain at the site of the incision that is unrelieved with pain medicine. Difficulty breathing with or without chest pain New calf  pain especially if only on one side Sudden, continuing increased vaginal bleeding with or without clots.   Contacts: For questions or concerns you should contact:  Dr. Jeral Pinch at [phone]  Joylene John, NP at [phone]  After Hours: call [phone] and have the GYN Oncologist paged/contacted (after 5 pm or on the weekends).  Messages sent via mychart are for non-urgent matters and are not responded to after hours so for urgent needs, please call the after hours number.

## 2021-12-01 ENCOUNTER — Telehealth: Payer: Self-pay

## 2021-12-01 NOTE — Telephone Encounter (Signed)
Spoke with Victoria Parsons this morning and reviewed CMP results. Creatinine is 0.99. Per Victoria John, NP Kidney function is better. Patient verbalized understanding and did not voice any questions. Instructed to call with any needs.

## 2021-12-02 ENCOUNTER — Encounter: Payer: Managed Care, Other (non HMO) | Admitting: Gynecologic Oncology

## 2021-12-08 ENCOUNTER — Telehealth: Payer: Self-pay

## 2021-12-08 ENCOUNTER — Encounter (HOSPITAL_COMMUNITY): Payer: Self-pay | Admitting: Gynecologic Oncology

## 2021-12-08 NOTE — Telephone Encounter (Signed)
Telephone call to check on pre-operative status.  Patient compliant with pre-operative instructions. Pt has held Arimidex since 12-02-21 as instructed by Joylene John, NP. Reinforced nothing to eat after midnight. Clear liquids until 0430. Patient to arrive at [place].  No questions or concerns voiced.  Instructed to call for any needs.  ?

## 2021-12-08 NOTE — Anesthesia Preprocedure Evaluation (Addendum)
Anesthesia Evaluation  ?Patient identified by MRN, date of birth, ID band ?Patient awake ? ? ? ?Reviewed: ?Allergy & Precautions, NPO status , Patient's Chart, lab work & pertinent test results ? ?Airway ?Mallampati: II ? ?TM Distance: >3 FB ?Neck ROM: Full ? ? ? Dental ?no notable dental hx. ?(+) Partial Lower, Partial Upper, Dental Advisory Given, Missing,  ?  ?Pulmonary ?former smoker,  ?  ?Pulmonary exam normal ?breath sounds clear to auscultation ? ? ? ? ? ? Cardiovascular ?Exercise Tolerance: Good ?Normal cardiovascular exam ?Rhythm:Regular Rate:Normal ? ? ?  ?Neuro/Psych ? Neuromuscular disease   ? GI/Hepatic ?negative GI ROS, Neg liver ROS,   ?Endo/Other  ?negative endocrine ROS ? Renal/GU ?Lab Results ?     Component                Value               Date                 ?     CREATININE               0.99                11/28/2021           ?     BUN                      19                  11/28/2021           ?     NA                       137                 11/28/2021           ?     K                        4.1                 11/28/2021           ?     CL                       106                 11/28/2021           ?     CO2                      22                  11/28/2021           ?  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 ? ?  ?Musculoskeletal ?negative musculoskeletal ROS ?(+)  ? Abdominal ?(+) + obese (31.82),   ?Peds ? Hematology ?Lab Results ?     Component                Value               Date                     ?     HGB  15.2 (H)            11/28/2021           ?     HCT                      45.8                11/28/2021             ?     PLT                      257                 11/28/2021           ?   ?Anesthesia Other Findings ?All: Codeine, PCn ? Reproductive/Obstetrics ? ?  ? ? ? ? ? ? ? ? ? ? ? ? ? ?  ?  ? ? ? ? ? ? ? ?Anesthesia Physical ?Anesthesia Plan ? ?ASA: 3 ? ?Anesthesia Plan: General  ? ?Post-op Pain  Management: Lidocaine infusion* and Ketamine IV*  ? ?Induction: Intravenous ? ?PONV Risk Score and Plan: 4 or greater and Treatment may vary due to age or medical condition, Midazolam, Ondansetron and Dexamethasone ? ?Airway Management Planned: Oral ETT ? ?Additional Equipment: None ? ?Intra-op Plan:  ? ?Post-operative Plan: Extubation in OR ? ?Informed Consent: I have reviewed the patients History and Physical, chart, labs and discussed the procedure including the risks, benefits and alternatives for the proposed anesthesia with the patient or authorized representative who has indicated his/her understanding and acceptance.  ? ? ? ?Dental advisory given ? ?Plan Discussed with:  ? ?Anesthesia Plan Comments: (GA w lido infusion +  ketamine)  ? ? ? ? ? ?Anesthesia Quick Evaluation ? ?

## 2021-12-09 ENCOUNTER — Ambulatory Visit (HOSPITAL_COMMUNITY): Payer: Commercial Managed Care - HMO | Admitting: Anesthesiology

## 2021-12-09 ENCOUNTER — Other Ambulatory Visit: Payer: Self-pay

## 2021-12-09 ENCOUNTER — Encounter (HOSPITAL_COMMUNITY): Payer: Self-pay | Admitting: Gynecologic Oncology

## 2021-12-09 ENCOUNTER — Encounter (HOSPITAL_COMMUNITY)
Admission: RE | Disposition: A | Discharge status: Home / Self Care | Payer: Self-pay | Source: Home / Self Care | Attending: Gynecologic Oncology

## 2021-12-09 ENCOUNTER — Ambulatory Visit (HOSPITAL_BASED_OUTPATIENT_CLINIC_OR_DEPARTMENT_OTHER): Payer: Commercial Managed Care - HMO | Admitting: Anesthesiology

## 2021-12-09 ENCOUNTER — Ambulatory Visit (HOSPITAL_COMMUNITY)
Admission: RE | Admit: 2021-12-09 | Discharge: 2021-12-09 | Disposition: A | Discharge status: Home / Self Care | Payer: Commercial Managed Care - HMO | Attending: Gynecologic Oncology | Admitting: Gynecologic Oncology

## 2021-12-09 DIAGNOSIS — N736 Female pelvic peritoneal adhesions (postinfective): Secondary | ICD-10-CM

## 2021-12-09 DIAGNOSIS — Z87891 Personal history of nicotine dependence: Secondary | ICD-10-CM | POA: Diagnosis not present

## 2021-12-09 DIAGNOSIS — D259 Leiomyoma of uterus, unspecified: Secondary | ICD-10-CM

## 2021-12-09 DIAGNOSIS — N72 Inflammatory disease of cervix uteri: Secondary | ICD-10-CM | POA: Insufficient documentation

## 2021-12-09 DIAGNOSIS — Z87410 Personal history of cervical dysplasia: Secondary | ICD-10-CM | POA: Insufficient documentation

## 2021-12-09 DIAGNOSIS — N879 Dysplasia of cervix uteri, unspecified: Secondary | ICD-10-CM | POA: Diagnosis not present

## 2021-12-09 DIAGNOSIS — Z853 Personal history of malignant neoplasm of breast: Secondary | ICD-10-CM | POA: Insufficient documentation

## 2021-12-09 DIAGNOSIS — R87613 High grade squamous intraepithelial lesion on cytologic smear of cervix (HGSIL): Secondary | ICD-10-CM | POA: Diagnosis not present

## 2021-12-09 DIAGNOSIS — G709 Myoneural disorder, unspecified: Secondary | ICD-10-CM | POA: Diagnosis not present

## 2021-12-09 HISTORY — DX: High grade squamous intraepithelial lesion on cytologic smear of cervix (HGSIL): R87.613

## 2021-12-09 HISTORY — PX: ROBOTIC ASSISTED TOTAL HYSTERECTOMY WITH BILATERAL SALPINGO OOPHERECTOMY: SHX6086

## 2021-12-09 LAB — ABO/RH: ABO/RH(D): A NEG

## 2021-12-09 SURGERY — HYSTERECTOMY, TOTAL, ROBOT-ASSISTED, LAPAROSCOPIC, WITH BILATERAL SALPINGO-OOPHORECTOMY
Anesthesia: General | Site: Abdomen | Laterality: Bilateral

## 2021-12-09 MED ORDER — GLYCOPYRROLATE PF 0.2 MG/ML IJ SOSY
PREFILLED_SYRINGE | INTRAMUSCULAR | Status: DC | PRN
Start: 2021-12-09 — End: 2021-12-09
  Administered 2021-12-09: .2 mg via INTRAVENOUS

## 2021-12-09 MED ORDER — LACTATED RINGERS IV SOLN: INTRAVENOUS | Status: DC | PRN

## 2021-12-09 MED ORDER — GLYCOPYRROLATE 0.2 MG/ML IJ SOLN
INTRAMUSCULAR | Status: AC
  Filled 2021-12-09: qty 2

## 2021-12-09 MED ORDER — SUGAMMADEX SODIUM 200 MG/2ML IV SOLN
INTRAVENOUS | Status: DC | PRN
  Administered 2021-12-09: 200 mg via INTRAVENOUS

## 2021-12-09 MED ORDER — MIDAZOLAM HCL 2 MG/2ML IJ SOLN
INTRAMUSCULAR | Status: AC
  Filled 2021-12-09: qty 2

## 2021-12-09 MED ORDER — LIDOCAINE HCL (PF) 2 % IJ SOLN
INTRAMUSCULAR | Status: AC
  Filled 2021-12-09: qty 20

## 2021-12-09 MED ORDER — ONDANSETRON HCL 4 MG/2ML IJ SOLN: 4.0000 mg | Freq: Once | INTRAMUSCULAR | Status: DC | PRN

## 2021-12-09 MED ORDER — BUPIVACAINE HCL 0.25 % IJ SOLN
INTRAMUSCULAR | Status: DC | PRN
  Administered 2021-12-09: 34 mL

## 2021-12-09 MED ORDER — CELECOXIB 200 MG PO CAPS
200.0000 mg | ORAL_CAPSULE | ORAL | Status: AC
  Administered 2021-12-09: 200 mg via ORAL
  Filled 2021-12-09: qty 1

## 2021-12-09 MED ORDER — ONDANSETRON HCL 4 MG/2ML IJ SOLN
INTRAMUSCULAR | Status: DC | PRN
  Administered 2021-12-09: 4 mg via INTRAVENOUS

## 2021-12-09 MED ORDER — HEPARIN SODIUM (PORCINE) 5000 UNIT/ML IJ SOLN
5000.0000 [IU] | INTRAMUSCULAR | Status: AC
  Administered 2021-12-09: 5000 [IU] via SUBCUTANEOUS
  Filled 2021-12-09: qty 1

## 2021-12-09 MED ORDER — KETAMINE HCL-SODIUM CHLORIDE 100-0.9 MG/10ML-% IV SOSY
PREFILLED_SYRINGE | INTRAVENOUS | Status: DC | PRN
  Administered 2021-12-09: 20 mg via INTRAVENOUS
  Administered 2021-12-09: 10 mg via INTRAVENOUS
  Administered 2021-12-09: 20 mg via INTRAVENOUS

## 2021-12-09 MED ORDER — CHLORHEXIDINE GLUCONATE 0.12 % MT SOLN
15.0000 mL | Freq: Once | OROMUCOSAL | Status: AC
  Administered 2021-12-09: 15 mL via OROMUCOSAL

## 2021-12-09 MED ORDER — LACTATED RINGERS IV SOLN: INTRAVENOUS | Status: DC

## 2021-12-09 MED ORDER — GABAPENTIN 300 MG PO CAPS
300.0000 mg | ORAL_CAPSULE | ORAL | Status: AC
  Administered 2021-12-09: 300 mg via ORAL
  Filled 2021-12-09: qty 1

## 2021-12-09 MED ORDER — MIDAZOLAM HCL 5 MG/5ML IJ SOLN
INTRAMUSCULAR | Status: DC | PRN
  Administered 2021-12-09: 2 mg via INTRAVENOUS

## 2021-12-09 MED ORDER — CIPROFLOXACIN IN D5W 400 MG/200ML IV SOLN
400.0000 mg | INTRAVENOUS | Status: AC
  Administered 2021-12-09: 400 mg via INTRAVENOUS
  Filled 2021-12-09: qty 200

## 2021-12-09 MED ORDER — FENTANYL CITRATE (PF) 100 MCG/2ML IJ SOLN
INTRAMUSCULAR | Status: DC | PRN
  Administered 2021-12-09: 100 ug via INTRAVENOUS

## 2021-12-09 MED ORDER — PROPOFOL 10 MG/ML IV BOLUS
INTRAVENOUS | Status: AC
  Filled 2021-12-09: qty 20

## 2021-12-09 MED ORDER — OXYCODONE HCL 5 MG/5ML PO SOLN: 5.0000 mg | Freq: Once | ORAL | Status: DC | PRN

## 2021-12-09 MED ORDER — CLINDAMYCIN PHOSPHATE 900 MG/50ML IV SOLN
900.0000 mg | INTRAVENOUS | Status: AC
  Administered 2021-12-09: 900 mg via INTRAVENOUS
  Filled 2021-12-09: qty 50

## 2021-12-09 MED ORDER — DEXAMETHASONE SODIUM PHOSPHATE 4 MG/ML IJ SOLN: 4.0000 mg | INTRAMUSCULAR | Status: DC

## 2021-12-09 MED ORDER — DEXAMETHASONE SODIUM PHOSPHATE 10 MG/ML IJ SOLN
INTRAMUSCULAR | Status: DC | PRN
  Administered 2021-12-09: 10 mg via INTRAVENOUS

## 2021-12-09 MED ORDER — FENTANYL CITRATE (PF) 100 MCG/2ML IJ SOLN
INTRAMUSCULAR | Status: AC
  Filled 2021-12-09: qty 2

## 2021-12-09 MED ORDER — ONDANSETRON HCL 4 MG/2ML IJ SOLN
INTRAMUSCULAR | Status: AC
  Filled 2021-12-09: qty 4

## 2021-12-09 MED ORDER — LIDOCAINE 2% (20 MG/ML) 5 ML SYRINGE
INTRAMUSCULAR | Status: DC | PRN
Start: 2021-12-09 — End: 2021-12-09
  Administered 2021-12-09: 80 mg via INTRAVENOUS
  Administered 2021-12-09: 1.5 mg/kg/h via INTRAVENOUS

## 2021-12-09 MED ORDER — ACETAMINOPHEN 500 MG PO TABS
1000.0000 mg | ORAL_TABLET | ORAL | Status: AC
  Administered 2021-12-09: 1000 mg via ORAL
  Filled 2021-12-09: qty 2

## 2021-12-09 MED ORDER — ROCURONIUM BROMIDE 10 MG/ML (PF) SYRINGE
PREFILLED_SYRINGE | INTRAVENOUS | Status: AC
  Filled 2021-12-09: qty 10

## 2021-12-09 MED ORDER — ORAL CARE MOUTH RINSE: 15.0000 mL | Freq: Once | OROMUCOSAL | Status: AC

## 2021-12-09 MED ORDER — PHENYLEPHRINE 40 MCG/ML (10ML) SYRINGE FOR IV PUSH (FOR BLOOD PRESSURE SUPPORT)
PREFILLED_SYRINGE | INTRAVENOUS | Status: DC | PRN
  Administered 2021-12-09 (×2): 80 ug via INTRAVENOUS

## 2021-12-09 MED ORDER — PROPOFOL 10 MG/ML IV BOLUS
INTRAVENOUS | Status: DC | PRN
  Administered 2021-12-09: 130 mg via INTRAVENOUS

## 2021-12-09 MED ORDER — KETAMINE HCL 50 MG/5ML IJ SOSY
PREFILLED_SYRINGE | INTRAMUSCULAR | Status: AC
  Filled 2021-12-09: qty 5

## 2021-12-09 MED ORDER — HYDROMORPHONE HCL 1 MG/ML IJ SOLN
INTRAMUSCULAR | Status: AC
  Filled 2021-12-09: qty 2

## 2021-12-09 MED ORDER — KETOROLAC TROMETHAMINE 30 MG/ML IJ SOLN
30.0000 mg | Freq: Once | INTRAMUSCULAR | Status: AC | PRN
  Administered 2021-12-09: 30 mg via INTRAVENOUS

## 2021-12-09 MED ORDER — ENSURE PRE-SURGERY PO LIQD
296.0000 mL | Freq: Once | ORAL | Status: DC
  Filled 2021-12-09: qty 296

## 2021-12-09 MED ORDER — DEXAMETHASONE SODIUM PHOSPHATE 10 MG/ML IJ SOLN
INTRAMUSCULAR | Status: AC
  Filled 2021-12-09: qty 2

## 2021-12-09 MED ORDER — OXYCODONE HCL 5 MG PO TABS: 5.0000 mg | ORAL_TABLET | Freq: Once | ORAL | Status: DC | PRN

## 2021-12-09 MED ORDER — BUPIVACAINE HCL 0.25 % IJ SOLN
INTRAMUSCULAR | Status: AC
  Filled 2021-12-09: qty 1

## 2021-12-09 MED ORDER — KETOROLAC TROMETHAMINE 30 MG/ML IJ SOLN
INTRAMUSCULAR | Status: AC
  Filled 2021-12-09: qty 1

## 2021-12-09 MED ORDER — LIDOCAINE HCL (PF) 2 % IJ SOLN
INTRAMUSCULAR | Status: AC
  Filled 2021-12-09: qty 15

## 2021-12-09 MED ORDER — SCOPOLAMINE 1 MG/3DAYS TD PT72
1.0000 | MEDICATED_PATCH | TRANSDERMAL | Status: DC
  Administered 2021-12-09: 1.5 mg via TRANSDERMAL
  Filled 2021-12-09: qty 1

## 2021-12-09 MED ORDER — LACTATED RINGERS IR SOLN
Status: DC | PRN
  Administered 2021-12-09: 1000 mL

## 2021-12-09 MED ORDER — ROCURONIUM BROMIDE 10 MG/ML (PF) SYRINGE
PREFILLED_SYRINGE | INTRAVENOUS | Status: DC | PRN
  Administered 2021-12-09: 20 mg via INTRAVENOUS
  Administered 2021-12-09: 60 mg via INTRAVENOUS
  Administered 2021-12-09: 20 mg via INTRAVENOUS

## 2021-12-09 MED ORDER — SODIUM CHLORIDE (PF) 0.9 % IJ SOLN
INTRAMUSCULAR | Status: AC
  Filled 2021-12-09: qty 10

## 2021-12-09 MED ORDER — HYDROMORPHONE HCL 1 MG/ML IJ SOLN: 0.2500 mg | INTRAMUSCULAR | Status: DC | PRN

## 2021-12-09 MED ORDER — PHENYLEPHRINE 40 MCG/ML (10ML) SYRINGE FOR IV PUSH (FOR BLOOD PRESSURE SUPPORT)
PREFILLED_SYRINGE | INTRAVENOUS | Status: AC
  Filled 2021-12-09: qty 10

## 2021-12-09 MED ORDER — STERILE WATER FOR IRRIGATION IR SOLN
Status: DC | PRN
  Administered 2021-12-09: 1000 mL

## 2021-12-09 MED ORDER — PHENYLEPHRINE HCL-NACL 20-0.9 MG/250ML-% IV SOLN
INTRAVENOUS | Status: AC
  Filled 2021-12-09: qty 500

## 2021-12-09 SURGICAL SUPPLY — 71 items
APPLICATOR SURGIFLO ENDO (HEMOSTASIS) IMPLANT
BACTOSHIELD CHG 4% 4OZ (MISCELLANEOUS) ×2
BAG COUNTER SPONGE SURGICOUNT (BAG) IMPLANT
BAG LAPAROSCOPIC 12 15 PORT 16 (BASKET) IMPLANT
BAG RETRIEVAL 12/15 (BASKET)
BAG RETRIEVAL 12/15MM (BASKET)
BAG SURGICOUNT SPONGE COUNTING (BAG)
BLADE SURG SZ10 CARB STEEL (BLADE) IMPLANT
COVER BACK TABLE 60X90IN (DRAPES) ×3 IMPLANT
COVER TIP SHEARS 8 DVNC (MISCELLANEOUS) ×1 IMPLANT
COVER TIP SHEARS 8MM DA VINCI (MISCELLANEOUS) ×2
DERMABOND ADVANCED (GAUZE/BANDAGES/DRESSINGS) ×2
DERMABOND ADVANCED .7 DNX12 (GAUZE/BANDAGES/DRESSINGS) ×1 IMPLANT
DRAPE ARM DVNC X/XI (DISPOSABLE) ×4 IMPLANT
DRAPE COLUMN DVNC XI (DISPOSABLE) ×1 IMPLANT
DRAPE DA VINCI XI ARM (DISPOSABLE) ×8
DRAPE DA VINCI XI COLUMN (DISPOSABLE) ×2
DRAPE SHEET LG 3/4 BI-LAMINATE (DRAPES) ×3 IMPLANT
DRAPE SURG IRRIG POUCH 19X23 (DRAPES) ×3 IMPLANT
DRSG OPSITE POSTOP 4X6 (GAUZE/BANDAGES/DRESSINGS) IMPLANT
DRSG OPSITE POSTOP 4X8 (GAUZE/BANDAGES/DRESSINGS) IMPLANT
ELECT PENCIL ROCKER SW 15FT (MISCELLANEOUS) ×2 IMPLANT
ELECT REM PT RETURN 15FT ADLT (MISCELLANEOUS) ×3 IMPLANT
GAUZE 4X4 16PLY ~~LOC~~+RFID DBL (SPONGE) ×3 IMPLANT
GLOVE SURG ENC MOIS LTX SZ6 (GLOVE) ×12 IMPLANT
GLOVE SURG ENC MOIS LTX SZ6.5 (GLOVE) ×6 IMPLANT
GOWN STRL REUS W/ TWL LRG LVL3 (GOWN DISPOSABLE) ×4 IMPLANT
GOWN STRL REUS W/TWL LRG LVL3 (GOWN DISPOSABLE) ×10
HOLDER FOLEY CATH W/STRAP (MISCELLANEOUS) IMPLANT
IRRIG SUCT STRYKERFLOW 2 WTIP (MISCELLANEOUS) ×3
IRRIGATION SUCT STRKRFLW 2 WTP (MISCELLANEOUS) ×1 IMPLANT
KIT PROCEDURE DA VINCI SI (MISCELLANEOUS)
KIT PROCEDURE DVNC SI (MISCELLANEOUS) IMPLANT
KIT TURNOVER KIT A (KITS) IMPLANT
MANIPULATOR ADVINCU DEL 3.0 PL (MISCELLANEOUS) IMPLANT
MANIPULATOR ADVINCU DEL 3.5 PL (MISCELLANEOUS) IMPLANT
MANIPULATOR UTERINE 4.5 ZUMI (MISCELLANEOUS) ×2 IMPLANT
NDL HYPO 21X1.5 SAFETY (NEEDLE) ×1 IMPLANT
NDL SPNL 18GX3.5 QUINCKE PK (NEEDLE) IMPLANT
NEEDLE HYPO 21X1.5 SAFETY (NEEDLE) ×3 IMPLANT
NEEDLE SPNL 18GX3.5 QUINCKE PK (NEEDLE) IMPLANT
OBTURATOR OPTICAL STANDARD 8MM (TROCAR) ×2
OBTURATOR OPTICAL STND 8 DVNC (TROCAR) ×1
OBTURATOR OPTICALSTD 8 DVNC (TROCAR) ×1 IMPLANT
PACK ROBOT GYN CUSTOM WL (TRAY / TRAY PROCEDURE) ×3 IMPLANT
PAD POSITIONING PINK XL (MISCELLANEOUS) ×3 IMPLANT
PORT ACCESS TROCAR AIRSEAL 12 (TROCAR) ×1 IMPLANT
PORT ACCESS TROCAR AIRSEAL 5M (TROCAR) ×2
POUCH SPECIMEN RETRIEVAL 10MM (ENDOMECHANICALS) IMPLANT
SCRUB CHG 4% DYNA-HEX 4OZ (MISCELLANEOUS) ×1 IMPLANT
SEAL CANN UNIV 5-8 DVNC XI (MISCELLANEOUS) ×4 IMPLANT
SEAL XI 5MM-8MM UNIVERSAL (MISCELLANEOUS) ×8
SET TRI-LUMEN FLTR TB AIRSEAL (TUBING) ×3 IMPLANT
SPIKE FLUID TRANSFER (MISCELLANEOUS) ×3 IMPLANT
SPONGE T-LAP 18X18 ~~LOC~~+RFID (SPONGE) IMPLANT
SURGIFLO W/THROMBIN 8M KIT (HEMOSTASIS) IMPLANT
SUT MNCRL AB 4-0 PS2 18 (SUTURE) IMPLANT
SUT PDS AB 1 TP1 96 (SUTURE) IMPLANT
SUT VIC AB 0 CT1 27 (SUTURE) ×2
SUT VIC AB 0 CT1 27XBRD ANTBC (SUTURE) IMPLANT
SUT VIC AB 2-0 CT1 27 (SUTURE)
SUT VIC AB 2-0 CT1 TAPERPNT 27 (SUTURE) IMPLANT
SUT VIC AB 4-0 PS2 18 (SUTURE) ×6 IMPLANT
SYR 10ML LL (SYRINGE) IMPLANT
TOWEL OR NON WOVEN STRL DISP B (DISPOSABLE) IMPLANT
TRAP SPECIMEN MUCUS 40CC (MISCELLANEOUS) IMPLANT
TRAY FOLEY MTR SLVR 16FR STAT (SET/KITS/TRAYS/PACK) ×3 IMPLANT
TROCAR XCEL NON-BLD 5MMX100MML (ENDOMECHANICALS) IMPLANT
UNDERPAD 30X36 HEAVY ABSORB (UNDERPADS AND DIAPERS) ×6 IMPLANT
WATER STERILE IRR 1000ML POUR (IV SOLUTION) ×3 IMPLANT
YANKAUER SUCT BULB TIP 10FT TU (MISCELLANEOUS) IMPLANT

## 2021-12-09 NOTE — Anesthesia Postprocedure Evaluation (Signed)
Anesthesia Post Note ? ?Patient: Victoria Parsons ? ?Procedure(s) Performed: XI ROBOTIC ASSISTED TOTAL HYSTERECTOMY WITH BILATERAL SALPINGO OOPHORECTOMY (Bilateral: Abdomen) ? ?  ? ?Patient location during evaluation: PACU ?Anesthesia Type: General ?Level of consciousness: awake and alert ?Pain management: pain level controlled ?Vital Signs Assessment: post-procedure vital signs reviewed and stable ?Respiratory status: spontaneous breathing, nonlabored ventilation, respiratory function stable and patient connected to nasal cannula oxygen ?Cardiovascular status: blood pressure returned to baseline and stable ?Postop Assessment: no apparent nausea or vomiting ?Anesthetic complications: no ? ? ?No notable events documented. ? ?Last Vitals:  ?Vitals:  ? 12/09/21 1035 12/09/21 1053  ?BP:  119/75  ?Pulse: 69 83  ?Resp: 15 16  ?Temp:  36.4 ?C  ?SpO2: 97% 97%  ?  ?Last Pain:  ?Vitals:  ? 12/09/21 1053  ?TempSrc: Oral  ?PainSc: 0-No pain  ? ? ?  ?  ?  ?  ?  ?  ? ?[place] ? ? ? ? ?

## 2021-12-09 NOTE — Discharge Instructions (Addendum)
AFTER SURGERY INSTRUCTIONS ?  ?Return to work: 4-6 weeks if applicable ? ?DO NOT START YOUR ARIMIDEX FOR ONE WEEK AFTER SURGERY. YOU CAN RESUME YOUR BABY ASPIRIN TOMORROW, MARCH 8.  ? ?FOR TYLENOL, THE MAXIMUM AMOUNT OF TYLENOL YOU CAN HAVE IN A 24 HOUR PERIOD IS 4,000 MG. YOU CAN TAKE [phone] MG EVERY SIX HOURS FOR PAIN.  ?  ?Activity: ?1. Be up and out of the bed during the day.  Take a nap if needed.  You may walk up steps but be careful and use the hand rail.  Stair climbing will tire you more than you think, you may need to stop part way and rest.  ?  ?2. No lifting or straining for 6 weeks over 10 pounds. No pushing, pulling, straining for 6 weeks. ?  ?3. No driving for around 1 week(s).  Do not drive if you are taking narcotic pain medicine and make sure that your reaction time has returned.  ?  ?4. You can shower as soon as the next day after surgery. Shower daily.  Use your regular soap and water (not directly on the incision) and pat your incision(s) dry afterwards; don't rub.  No tub baths or submerging your body in water until cleared by your surgeon. If you have the soap that was given to you by pre-surgical testing that was used before surgery, you do not need to use it afterwards because this can irritate your incisions.  ?  ?5. No sexual activity and nothing in the vagina for 8 weeks. ?  ?6. You may experience a small amount of clear drainage from your incisions, which is normal.  If the drainage persists, increases, or changes color please call the office. ?  ?7. Do not use creams, lotions, or ointments such as neosporin on your incisions after surgery until advised by your surgeon because they can cause removal of the dermabond glue on your incisions.   ?  ?8. You may experience vaginal spotting after surgery or around the 6-8 week mark from surgery when the stitches at the top of the vagina begin to dissolve.  The spotting is normal but if you experience heavy bleeding, call our office. ?  ?9.  Take Tylenol or ibuprofen first for pain and only use narcotic pain medication for severe pain not relieved by the Tylenol or Ibuprofen.  Monitor your Tylenol intake to a max of 4,000 mg in a 24 hour period. You can alternate these medications after surgery. ?  ?Diet: ?1. Low sodium Heart Healthy Diet is recommended but you are cleared to resume your normal (before surgery) diet after your procedure. ?  ?2. It is safe to use a laxative, such as Miralax or Colace, if you have difficulty moving your bowels. You have been prescribed Sennakot-S to take at bedtime every evening after surgery to keep bowel movements regular and to prevent constipation.   ?  ?Wound Care: ?1. Keep clean and dry.  Shower daily. ?  ?Reasons to call the Doctor: ?Fever - Oral temperature greater than 100.4 degrees Fahrenheit ?Foul-smelling vaginal discharge ?Difficulty urinating ?Nausea and vomiting ?Increased pain at the site of the incision that is unrelieved with pain medicine. ?Difficulty breathing with or without chest pain ?New calf pain especially if only on one side ?Sudden, continuing increased vaginal bleeding with or without clots. ?  ?Contacts: ?For questions or concerns you should contact: ?  ?Dr. Jeral Pinch at [phone] ?  ?Joylene John, NP at [phone] ?  ?After  Hours: call [phone] and have the GYN Oncologist paged/contacted (after 5 pm or on the weekends). ?  ?Messages sent via mychart are for non-urgent matters and are not responded to after hours so for urgent needs, please call the after hours number. ?  ?

## 2021-12-09 NOTE — Op Note (Signed)
OPERATIVE NOTE  Pre-operative Diagnosis: History of cervical dysplasia, HPV infection, anatomy precludes additional cervical conization procedure  Post-operative Diagnosis: same, significant adhesive disease from prior CKCs  Operation: Robotic-assisted laparoscopic total hysterectomy with bilateral salpingoophorectomy, SLN biopsy   Surgeon: Jeral Pinch MD  Assistant Surgeon: Lahoma Crocker MD (an MD assistant was necessary for tissue manipulation, management of robotic instrumentation, retraction and positioning due to the complexity of the case and hospital policies).   Anesthesia: GET  Urine Output: 150cc  Operative Findings: On EUA, cervix flush with the vagina. NO visible lesions. Uterus small and mobile. On intra-abdominal entry, normal upper abdominal survey. Normal omentum, small and large bowel. Uterus 6cm with several small <1cm fibroids. Normal and atrophic adnexa. Significant adhesive disease between the bladder and the anterior cervix and LUS. No intra-abdominal or pelvic evidence of disease.  Estimated Blood Loss:  50 cc      Total IV Fluids: see I&O flowsheet         Specimens: uterus, cervix, bilateral tubes and ovaries         Complications:  None apparent; patient tolerated the procedure well.         Disposition: PACU - hemodynamically stable.  Procedure Details  The patient was seen in the Holding Room. The risks, benefits, complications, treatment options, and expected outcomes were discussed with the patient.  The patient concurred with the proposed plan, giving informed consent.  The site of surgery properly noted/marked. The patient was identified as Victoria Parsons and the procedure verified as a Robotic-assisted hysterectomy with bilateral salpingo oophorectomy.   After induction of anesthesia, the patient was draped and prepped in the usual sterile manner. Patient was placed in supine position after anesthesia and draped and prepped in the usual sterile  manner as follows: Her arms were tucked to her side with all appropriate precautions.  The shoulders were stabilized with padded shoulder blocks applied to the acromium processes.  The patient was placed in the semi-lithotomy position in [place].  The perineum and vagina were prepped with CholoraPrep. The patient was draped after the CholoraPrep had been allowed to dry for 3 minutes.  A Time Out was held and the above information confirmed.  The urethra was prepped with Betadine. Foley catheter was placed.  A sterile speculum was placed in the vagina.  The cervix was grasped with a single-tooth tenaculum. The cervix was dilated with Kennon Rounds dilators.  The ZUMI uterine manipulator with a small colpotomizer ring was placed without difficulty.  A pneum occluder balloon was placed over the manipulator.  OG tube placement was confirmed and to suction.   Next, a 10 mm skin incision was made 1 cm below the subcostal margin in the midclavicular line.  The 5 mm Optiview port and scope was used for direct entry.  Opening pressure was under 10 mm CO2.  The abdomen was insufflated and the findings were noted as above.   At this point and all points during the procedure, the patient's intra-abdominal pressure did not exceed 15 mmHg. Next, an 8 mm skin incision was made superior to the umbilicus and a right and left port were placed about 8 cm lateral to the robot port on the right and left side.  The 5 mm assist trocar was exchanged for a 10-12 mm port. All ports were placed under direct visualization.  The patient was placed in steep Trendelenburg.  Bowel was folded away into the upper abdomen.  The robot was docked in the normal  manner.  The right and left peritoneum were opened parallel to the IP ligament to open the retroperitoneal spaces bilaterally. The round ligaments were transected. The ureter was noted to be on the medial leaf of the broad ligament.  The peritoneum above the ureter was incised and stretched  and the infundibulopelvic ligament was skeletonized, cauterized and cut.    The posterior peritoneum was taken down to the level of the KOH ring.  The anterior peritoneum was also taken down.  The bladder flap was created to the level of the KOH ring. The bladder was significantly adherent to the cervix. The uterine artery on the right side was skeletonized, cauterized and cut in the normal manner.  A similar procedure was performed on the left.  The colpotomy was made and the uterus, cervix, bilateral ovaries and tubes were amputated and delivered through the vagina.  Pedicles were inspected and excellent hemostasis was achieved.    The colpotomy at the vaginal cuff was closed with Vicryl on a CT1 needle in a running manner.  Irrigation was used and excellent hemostasis was achieved.  At this point in the procedure was completed.  Robotic instruments were removed under direct visulaization.  The robot was undocked. The fascia at the 10-12 mm port was closed with 0 Vicryl on a UR-5 needle.  The subcuticular tissue was closed with 4-0 Vicryl and the skin was closed with 4-0 Monocryl in a subcuticular manner.  Dermabond was applied.    The vagina was swabbed with  minimal bleeding noted. Foley catheter was removed.  All sponge, lap and needle counts were correct x  3.   The patient was transferred to the recovery room in stable condition.  Jeral Pinch, MD

## 2021-12-09 NOTE — Anesthesia Procedure Notes (Signed)
Procedure Name: Intubation ?Date/Time: 12/09/2021 7:56 AM ?Performed by: Lavina Hamman, CRNA ?Pre-anesthesia Checklist: Patient identified, Emergency Drugs available, Suction available, Patient being monitored and Timeout performed ?Patient Re-evaluated:Patient Re-evaluated prior to induction ?Oxygen Delivery Method: Circle system utilized ?Preoxygenation: Pre-oxygenation with 100% oxygen ?Induction Type: IV induction ?Ventilation: Mask ventilation without difficulty ?Laryngoscope Size: Mac and 3 ?Grade View: Grade I ?Tube type: Oral ?Tube size: 7.0 mm ?Number of attempts: 1 ?Airway Equipment and Method: Stylet ?Placement Confirmation: ETT inserted through vocal cords under direct vision, positive ETCO2, CO2 detector and breath sounds checked- equal and bilateral ?Secured at: 21 cm ?Tube secured with: Tape ?Dental Injury: Teeth and Oropharynx as per pre-operative assessment  ?Comments: ATOI ? ? ? ? ?

## 2021-12-09 NOTE — H&P (Signed)
Gynecologic Oncology H&P ? ?12/09/21 ? ? ?HPI: Victoria Parsons is a 61 y.o. y.o. female who was initially seen for evaluation of long history of cervical dysplasia, most recent Pap smear showing high-grade dysplasia. ?  ?Patient denies any vaginal bleeding or discharge.  She went through menopause at the age of 29.  She has been using vitamin E vaginal suppositories for dryness.  She endorses a good appetite without nausea or emesis.  Reports normal bowel and bladder function. ? ?Patient has a history of breast cancer x2 in her left breast.  She had radiation and is now on anastrozole, which she will finish later this year, for total of 5 years. ?  ?She lives in [place] with her husband.  She reports tobacco use although is working on quitting.  She is down to 10-12 cigarettes/day.  Is planning to investigate hypnosis.  She is retired. ?  ?Dysplasia history ?Notes having cryotherapy about 30 years ago for abnormal cells on her cervix. ?She had normal Pap smears after. ?s/p CKC in 2017 after HSIL pap with negative colposcopy. CKC returned negative. ?Pap in 08/2020: HSIL pap, +HR HPV. ?Colposcopy in 2021: Biopsies negative ?CKC in 10/2020. I can't see these records in Care Everywhere, but the patient reports final results were negative for dysplasia. ?Pap on 09/10/2021: H SIL.  High risk HPV positive ?Colposcopy on 10/17/2021: Cervical biopsy from 8:00 shows CIN-1 with HPV effect.  ECC shows small fragments of detached squamous mucosa with features consistent with high-grade dysplasia.  Numerous fragments of squamous mucosa with low-grade intraepithelial lesion also noted. ?  ?Past Medical/Surgical History: ?Past Medical History:  ?Diagnosis Date  ? Breast cancer   ? on Anastrazole - 5 years will be fall 2023  ? 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 Hypercalcemia   ? Hypercholesterolemia   ? Osteopenia 04/19/2019  ? Sixth (abducent) nerve palsy, right eye 2019  ? from TIA in 2019 is now resolved  ?  Tobacco abuse   ? quit 11/17/21 and takes wellbutrin while stopping smoking  ? ? ?Past Surgical History:  ?Procedure Laterality Date  ? BREAST LUMPECTOMY Left 2014  ? benign  ? BREAST SURGERY Left 2018  ? CA  ? CERVICAL CONE BIOPSY N/A 11/2017  ? CERVICAL CONE BIOPSY N/A 11/2019  ? pilonidal cyst N/A   ? procedure in high school  ? TONSILLECTOMY Right   ? as a child  ? ? ?Family History  ?Problem Relation Age of Onset  ? Hypertension Mother   ? Hyperlipidemia Mother   ? Diabetes Mother   ? Arthritis Mother   ? Alzheimer's disease Mother   ? Osteoarthritis Mother   ? Hypertension Father   ? Hyperlipidemia Father   ? Diabetes Father   ? Coronary artery disease Father   ? Hypothyroidism Sister   ? Anxiety disorder Sister   ? Depression Sister   ? Breast cancer Paternal Aunt   ? Breast cancer Maternal Grandmother   ? Breast cancer Cousin   ? Hyperparathyroidism Neg Hx   ? Pancreatic cancer Neg Hx   ? Prostate cancer Neg Hx   ? Colon cancer Neg Hx   ? Ovarian cancer Neg Hx   ? Endometrial cancer Neg Hx   ? ? ?Social History  ? ?Socioeconomic History  ? Marital status: Married  ?  Spouse name: Not on file  ? Number of children: 1  ? Years of education: Not on file  ? Highest education level:  Not on file  ?Occupational History  ?  Employer: [employer]  ?Tobacco Use  ? Smoking status: Former  ?  Packs/day: 0.50  ?  Years: 40.00  ?  Pack years: 20.00  ?  Types: Cigarettes  ?  Quit date: 11/17/2021  ?  Years since quitting: 0.0  ? Smokeless tobacco: Never  ?Vaping Use  ? Vaping Use: Never used  ?Substance and Sexual Activity  ? Alcohol use: Not Currently  ? Drug use: Never  ? Sexual activity: Not Currently  ?Other Topics Concern  ? Not on file  ?Social History Narrative  ? Not on file  ? ?Social Determinants of Health  ? ?Financial Resource Strain: Not on file  ?Food Insecurity: Not on file  ?Transportation Needs: Not on file  ?Physical Activity: Not on file  ?Stress: Not on file  ?Social Connections: Not on file  ? ? ?Current  Medications: ? ?Current Facility-Administered Medications:  ?  clindamycin (CLEOCIN) IVPB 900 mg, 900 mg, Intravenous, On Call to OR **AND** ciprofloxacin (CIPRO) IVPB 400 mg, 400 mg, Intravenous, On Call to OR, Cross, Melissa D, NP ?  dexamethasone (DECADRON) injection 4 mg, 4 mg, Intravenous, On Call to OR, Cross, Melissa D, NP ?  [START ON 12/10/2021] feeding supplement (ENSURE PRE-SURGERY) liquid 296 mL, 296 mL, Oral, Once, Cross, Melissa D, NP ?  lactated ringers infusion, , Intravenous, Continuous, Belinda Block, MD, Last Rate: 10 mL/hr at 12/09/21 0628, New Bag at 12/09/21 7622 ?  phenylephrine (NEOSYNEPHRINE) 20-0.9 MG/250ML-% infusion, , , ,  ?  scopolamine (TRANSDERM-SCOP) 1 MG/3DAYS 1.5 mg, 1 patch, Transdermal, On Call to OR, Cross, Melissa D, NP, 1.5 mg at 12/09/21 0616 ? ?Review of Systems: ?+ Dyspareunia ?Denies appetite changes, fevers, chills, fatigue, unexplained weight changes. ?Denies hearing loss, neck lumps or masses, mouth sores, ringing in ears or voice changes. ?Denies cough or wheezing.  Denies shortness of breath. ?Denies chest pain or palpitations. Denies leg swelling. ?Denies abdominal distention, pain, blood in stools, constipation, diarrhea, nausea, vomiting, or early satiety. ?Denies dysuria, frequency, hematuria or incontinence. ?Denies hot flashes, pelvic pain, vaginal bleeding or vaginal discharge.   ?Denies joint pain, back pain or muscle pain/cramps. ?Denies itching, rash, or wounds. ?Denies dizziness, headaches, numbness or seizures. ?Denies swollen lymph nodes or glands, denies easy bruising or bleeding. ?Denies anxiety, depression, confusion, or decreased concentration. ?  ?Physical Exam: ?BP 119/84   Pulse 72   Temp 97.6 ?F (36.4 ?C) (Oral)   Resp 16   Ht '5\' 2"'$  (1.575 m)   Wt 173 lb 15.1 oz (78.9 kg)   SpO2 98%   BMI 31.81 kg/m?  ?General: Alert, oriented, no acute distress.  ?HEENT: Normocephalic, atraumatic. Sclera anicteric.  ?Chest: Clear to auscultation  bilaterally. No wheezes, rhonchi, or rales. ?Cardiovascular: Regular rate and rhythm, no murmurs, rubs, or gallops.  ?Abdomen: Normoactive bowel sounds. Soft, nondistended, nontender to palpation. No masses or hepatosplenomegaly appreciated. No palpable fluid wave.  ?Extremities: Grossly normal range of motion. Warm, well perfused. No edema bilaterally.  ? ?Laboratory & Radiologic Studies: ?CBC ?   ?Component Value Date/Time  ? WBC 7.9 11/28/2021 1330  ? RBC 4.79 11/28/2021 1330  ? HGB 15.2 (H) 11/28/2021 1330  ? HGB 15.8 06/26/2021 0902  ? HCT 45.8 11/28/2021 1330  ? HCT 46.9 (H) 06/26/2021 0902  ? PLT 257 11/28/2021 1330  ? PLT 276 06/26/2021 0902  ? MCV 95.6 11/28/2021 1330  ? MCV 95 06/26/2021 0902  ? MCH 31.7 11/28/2021 1330  ?  MCHC 33.2 11/28/2021 1330  ? RDW 12.0 11/28/2021 1330  ? RDW 12.6 06/26/2021 0902  ? LYMPHSABS 1.7 06/26/2021 0902  ? MONOABS 0.5 09/29/2018 1440  ? EOSABS 0.1 06/26/2021 0902  ? BASOSABS 0.1 06/26/2021 0902  ? ?BMP Latest Ref Rng & Units 11/28/2021 06/26/2021 05/01/2021  ?Glucose 70 - 99 mg/dL 84 98 91  ?BUN 6 - 20 mg/dL '19 15 16  '$ ?Creatinine 0.44 - 1.00 mg/dL 0.99 1.18(H) 1.02  ?BUN/Creat Ratio 12 - 28 - 13 -  ?Sodium 135 - 145 mmol/L 137 142 138  ?Potassium 3.5 - 5.1 mmol/L 4.1 4.8 4.3  ?Chloride 98 - 111 mmol/L 106 105 104  ?CO2 22 - 32 mmol/L '22 21 26  '$ ?Calcium 8.9 - 10.3 mg/dL 10.4(H) 11.1(H) 11.3(H)  ? ?Assessment & Plan: ?Victoria Parsons is a 61 y.o. 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 ? ?See note from 1/30 for counseling. After discussion, plan to move forward with definitive surgery (TRH/BSO) today.  ?  ?Jeral Pinch, MD  ?Division of Gynecologic Oncology  ?Department of Obstetrics and Gynecology  ?[facility]  ? ?

## 2021-12-09 NOTE — Transfer of Care (Signed)
Immediate Anesthesia Transfer of Care Note ? ?Patient: Victoria Parsons ? ?Procedure(s) Performed: Procedure(s): ?XI ROBOTIC ASSISTED TOTAL HYSTERECTOMY WITH BILATERAL SALPINGO OOPHORECTOMY (Bilateral) ? ?Patient Location: PACU ? ?Anesthesia Type:General ? ?Level of Consciousness:  sedated, patient cooperative and responds to stimulation ? ?Airway & Oxygen Therapy:Patient Spontanous Breathing and Patient connected to face mask oxgen ? ?Post-op Assessment:  Report given to PACU RN and Post -op Vital signs reviewed and stable ? ?Post vital signs:  Reviewed and stable ? ?Last Vitals:  ?Vitals:  ? 12/09/21 0633  ?BP: 119/84  ?Pulse: 72  ?Resp: 16  ?Temp: 36.4 ?C  ?SpO2: 98%  ? ? ?Complications: No apparent anesthesia complications ? ?

## 2021-12-10 ENCOUNTER — Encounter (HOSPITAL_COMMUNITY): Payer: Self-pay | Admitting: Gynecologic Oncology

## 2021-12-10 ENCOUNTER — Telehealth: Payer: Self-pay | Admitting: *Deleted

## 2021-12-10 NOTE — Telephone Encounter (Signed)
Spoke with Victoria Parsons this morning. She states she is eating, drinking and urinating well. She has not had a BM yet but is passing gas. She is taking senokot as prescribed and encouraged her to drink plenty of water. She denies fever or chills. Incisions are dry and intact. She rates her pain 2/10. Her pain is controlled with tramadol. Reminded pt of her phone appointment with Dr.Tucker on 12/16/21 @ 4:20 and an in office visit on 12/22/21 @ 3pm. Also reminded pt she can restart her baby ASA today and Arimidex 1 wk after surgery.    ? ?Instructed to call office with any fever, chills, purulent drainage, uncontrolled pain or any other questions or concerns. Patient verbalizes understanding.  ? ?Pt aware of post op appointments as well as the office number [phone] and after hours number [phone] to call if she has any questions or concerns  ?

## 2021-12-11 LAB — SURGICAL PATHOLOGY

## 2021-12-15 ENCOUNTER — Telehealth: Payer: Self-pay | Admitting: *Deleted

## 2021-12-15 ENCOUNTER — Other Ambulatory Visit: Payer: Self-pay | Admitting: Gynecologic Oncology

## 2021-12-15 DIAGNOSIS — B370 Candidal stomatitis: Secondary | ICD-10-CM

## 2021-12-15 MED ORDER — NYSTATIN 100000 UNIT/ML MT SUSP: 5.0000 mL | Freq: Three times a day (TID) | OROMUCOSAL | 0 refills | Status: DC | PRN

## 2021-12-15 NOTE — Telephone Encounter (Signed)
Spoke with pt this afternoon and informed her that Melissa, NP has put in an order for magic mouth wash for pt to use.  She can pick up her prescription at [facility] in [place].  Since she is experiencing symptoms in her throat as well she can swish and swallow the mouth wash prescription per Lenna Sciara, NP. Pt verbalized understanding and did not have any further questions.  ?

## 2021-12-15 NOTE — Telephone Encounter (Signed)
Spoke with pt this afternoon. Pt stated that on Saturday she noticed her tongue was white.  She scrapped off the white buildup and it left her tongue raw, red, and burning in her mouth as well as on the roof of her mouth and tongue.  She endorses cracks in the corner of her mouth. She has been doing salt water rinses and once tried a vinegar rinse but it was very painful and has just been doing salt water rinses. She is not currently on any antibiotics or inhalers. She does not endorse any other symptoms. Melissa, NP notified and will send in a prescription for magic mouth wash.  ?

## 2021-12-15 NOTE — Progress Notes (Signed)
See RN note. Plan to send in magic mouthwash to treat symptoms of thrush, mouth/throat soreness. Recent IV antibiotics given intra-operatively. ?

## 2021-12-16 ENCOUNTER — Encounter: Payer: Self-pay | Admitting: Gynecologic Oncology

## 2021-12-16 ENCOUNTER — Inpatient Hospital Stay: Payer: Managed Care, Other (non HMO) | Attending: Gynecologic Oncology | Admitting: Gynecologic Oncology

## 2021-12-16 DIAGNOSIS — Z87410 Personal history of cervical dysplasia: Secondary | ICD-10-CM

## 2021-12-16 DIAGNOSIS — Z90722 Acquired absence of ovaries, bilateral: Secondary | ICD-10-CM

## 2021-12-16 DIAGNOSIS — Z90710 Acquired absence of both cervix and uterus: Secondary | ICD-10-CM

## 2021-12-16 DIAGNOSIS — B379 Candidiasis, unspecified: Secondary | ICD-10-CM

## 2021-12-16 DIAGNOSIS — R87613 High grade squamous intraepithelial lesion on cytologic smear of cervix (HGSIL): Secondary | ICD-10-CM

## 2021-12-16 MED ORDER — FLUCONAZOLE 150 MG PO TABS: 150.0000 mg | ORAL_TABLET | Freq: Every day | ORAL | 0 refills | Status: DC

## 2021-12-16 NOTE — Progress Notes (Signed)
Gynecologic Oncology Telehealth Consult Note: Gyn-Onc ? ?I connected with Victoria Parsons on 12/16/21 at  4:20 PM EDT by telephone and verified that I am speaking with the correct person using two identifiers. ? ?I discussed the limitations, risks, security and privacy concerns of performing an evaluation and management service by telemedicine and the availability of in-person appointments. I also discussed with the patient that there may be a patient responsible charge related to this service. The patient expressed understanding and agreed to proceed. ? ?Other persons participating in the visit and their role in the encounter: none. ? ?Patient's location: home ?Provider's location: [facility] ? ?Reason for Visit: follow-up after surgery ? ?Treatment History: ?12/09/21: TRH/BSO ? ?Interval History: ?Doing well. Denies pain, hasn't used pain medicine since last Wednesday. Reports normal bowel and bladder function. Has had some oral thrush, today developed vagina discharge and symptoms of yeast infection.  ? ?Past Medical/Surgical History: ?Past Medical History:  ?Diagnosis Date  ? Breast cancer   ? on Anastrazole - 5 years will be fall 2023  ? 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 Hypercalcemia   ? Hypercholesterolemia   ? Osteopenia 04/19/2019  ? Sixth (abducent) nerve palsy, right eye 2019  ? from TIA in 2019 is now resolved  ? Tobacco abuse   ? quit 11/17/21 and takes wellbutrin while stopping smoking  ? ? ?Past Surgical History:  ?Procedure Laterality Date  ? BREAST LUMPECTOMY Left 2014  ? benign  ? BREAST SURGERY Left 2018  ? CA  ? CERVICAL CONE BIOPSY N/A 11/2017  ? CERVICAL CONE BIOPSY N/A 11/2019  ? pilonidal cyst N/A   ? procedure in high school  ? ROBOTIC ASSISTED TOTAL HYSTERECTOMY WITH BILATERAL SALPINGO OOPHERECTOMY Bilateral 12/09/2021  ? Procedure: XI ROBOTIC ASSISTED TOTAL HYSTERECTOMY WITH BILATERAL SALPINGO OOPHORECTOMY;  Surgeon: Lafonda Mosses, MD;  Location: [facility];  Service:  Gynecology;  Laterality: Bilateral;  ? TONSILLECTOMY Right   ? as a child  ? ? ?Family History  ?Problem Relation Age of Onset  ? Hypertension Mother   ? Hyperlipidemia Mother   ? Diabetes Mother   ? Arthritis Mother   ? Alzheimer's disease Mother   ? Osteoarthritis Mother   ? Hypertension Father   ? Hyperlipidemia Father   ? Diabetes Father   ? Coronary artery disease Father   ? Hypothyroidism Sister   ? Anxiety disorder Sister   ? Depression Sister   ? Breast cancer Paternal Aunt   ? Breast cancer Maternal Grandmother   ? Breast cancer Cousin   ? Hyperparathyroidism Neg Hx   ? Pancreatic cancer Neg Hx   ? Prostate cancer Neg Hx   ? Colon cancer Neg Hx   ? Ovarian cancer Neg Hx   ? Endometrial cancer Neg Hx   ? ? ?Social History  ? ?Socioeconomic History  ? Marital status: Married  ?  Spouse name: Not on file  ? Number of children: 1  ? Years of education: Not on file  ? Highest education level: Not on file  ?Occupational History  ?  Employer: [employer]  ?Tobacco Use  ? Smoking status: Former  ?  Packs/day: 0.50  ?  Years: 40.00  ?  Pack years: 20.00  ?  Types: Cigarettes  ?  Quit date: 11/17/2021  ?  Years since quitting: 0.0  ? Smokeless tobacco: Never  ?Vaping Use  ? Vaping Use: Never used  ?Substance and Sexual Activity  ? Alcohol use: Not Currently  ?  Drug use: Never  ? Sexual activity: Not Currently  ?Other Topics Concern  ? Not on file  ?Social History Narrative  ? Not on file  ? ?Social Determinants of Health  ? ?Financial Resource Strain: Not on file  ?Food Insecurity: Not on file  ?Transportation Needs: Not on file  ?Physical Activity: Not on file  ?Stress: Not on file  ?Social Connections: Not on file  ? ? ?Current Medications: ? ?Current Outpatient Medications:  ?  atorvastatin (LIPITOR) 20 MG tablet, TAKE ONE (1) TABLET BY MOUTH EVERY DAY, Disp: 90 tablet, Rfl: 1 ?  Bilberry, Vaccinium myrtillus, (BILBERRY EXTRACT PO), Take 1 capsule by mouth daily., Disp: , Rfl:  ?  buPROPion (WELLBUTRIN SR) 150 MG 12  hr tablet, TAKE 1 TABLET BY  MOUTH TWICE DAILY (Patient taking differently: 150 mg daily.), Disp: 180 tablet, Rfl: 1 ?  cholecalciferol (VITAMIN D3) 25 MCG (1000 UNIT) tablet, Take 1,000 Units by mouth daily., Disp: , Rfl:  ?  loratadine (CLARITIN) 10 MG tablet, Take 10 mg by mouth at bedtime. , Disp: , Rfl:  ?  LUTEIN PO, Take 1 capsule by mouth daily., Disp: , Rfl:  ?  magic mouthwash (nystatin, lidocaine, diphenhydrAMINE, alum & mag hydroxide) suspension, Swish and swallow 5 mLs 3 (three) times daily as needed for mouth pain., Disp: 180 mL, Rfl: 0 ?  senna-docusate (SENOKOT-S) 8.6-50 MG tablet, Take 2 tablets by mouth at bedtime. For AFTER surgery, do not take if having diarrhea, Disp: 30 tablet, Rfl: 0 ?  sodium chloride (OCEAN) 0.65 % SOLN nasal spray, Place 1 spray into both nostrils as needed for congestion., Disp: , Rfl:  ?  traMADol (ULTRAM) 50 MG tablet, Take 1 tablet (50 mg total) by mouth every 6 (six) hours as needed for severe pain. For AFTER surgery only, do not take and drive, Disp: 10 tablet, Rfl: 0 ?  VITAMIN E EX, Place 200 Units vaginally 2 (two) times a week., Disp: , Rfl:  ? ?Review of Symptoms: ?Pertinent positive as per HPI. ? ?Physical Exam: ?There were no vitals taken for this visit. ?Deferred given limitations of phone visit. ? ?Laboratory & Radiologic Studies: ?A. UTERUS, CERVIX, BILATERAL FALLOPIAN TUBES AND OVARIES:  ?Chronic cervicitis with squamous metaplasia  ?Negative for residual dysplasia (see comment)  ?Cervical transformation zone changes consistent with prior procedure  ?Benign leiomyomata, intramural and subserosal, measuring up to 0.8 cm in  ?greatest dimension  ?Benign inactive to atrophic endometrium with cystic change  ?Benign fallopian tubes and ovaries  ?Benign cystic [place] rest of the left fallopian tube  ? ?COMMENT:  ?The entire cervix is submitted and much of the epithelium is denuded.  ?That which remains appears markedly atrophic with crowded hyperchromatic   ?nuclei and a patchy inflammatory cell infiltrate.  Two  ?immunohistochemical stains are performed with adequate control on two  ?representative sections of the cervix.  The HPV surrogate marker p16 is  ?negative within the epithelium present within the sections and the  ?proliferation marker Ki-67 reveals no increased proliferation.  This  ?immunohistochemical pattern supports the above diagnosis.  ? ?Assessment & Plan: ?Victoria Parsons is a 61 y.o. woman 1 week s/p TRH/BSO in the setting of a history of cervical dysplasia . ? ?Doing well post-op. Has oral thrush and vaginal candidiasis. Sent script for Diflucan to her pharmacy. ? ?Discussed final pathology. Briefly reviewed surveillance plan. ? ?I discussed the assessment and treatment plan with the patient. The patient was provided with an opportunity to  ask questions and all were answered. The patient agreed with the plan and demonstrated an understanding of the instructions.  ? ?The patient was advised to call back or see an in-person evaluation if the symptoms worsen or if the condition fails to improve as anticipated.  ? ?16 minutes of total time was spent for this patient encounter, including preparation, phone counseling with the patient and coordination of care, and documentation of the encounter. ? ? ?Jeral Pinch, MD  ?Division of Gynecologic Oncology  ?Department of Obstetrics and Gynecology  ?[facility]  ? ?

## 2021-12-24 ENCOUNTER — Encounter: Payer: Self-pay | Admitting: Gynecologic Oncology

## 2021-12-25 ENCOUNTER — Other Ambulatory Visit: Payer: Self-pay | Admitting: Oncology

## 2021-12-25 ENCOUNTER — Other Ambulatory Visit: Payer: Self-pay

## 2021-12-25 ENCOUNTER — Encounter: Payer: Self-pay | Admitting: Physician Assistant

## 2021-12-25 ENCOUNTER — Ambulatory Visit (INDEPENDENT_AMBULATORY_CARE_PROVIDER_SITE_OTHER): Payer: Managed Care, Other (non HMO) | Admitting: Physician Assistant

## 2021-12-25 VITALS — BP 130/82 | HR 72 | Temp 97.8°F | Ht 62.0 in | Wt 177.0 lb

## 2021-12-25 DIAGNOSIS — Z23 Encounter for immunization: Secondary | ICD-10-CM | POA: Diagnosis not present

## 2021-12-25 DIAGNOSIS — R5383 Other fatigue: Secondary | ICD-10-CM

## 2021-12-25 DIAGNOSIS — M25551 Pain in right hip: Secondary | ICD-10-CM | POA: Diagnosis not present

## 2021-12-25 DIAGNOSIS — E782 Mixed hyperlipidemia: Secondary | ICD-10-CM | POA: Diagnosis not present

## 2021-12-25 DIAGNOSIS — K130 Diseases of lips: Secondary | ICD-10-CM

## 2021-12-25 DIAGNOSIS — M8580 Other specified disorders of bone density and structure, unspecified site: Secondary | ICD-10-CM | POA: Diagnosis not present

## 2021-12-25 DIAGNOSIS — Z1231 Encounter for screening mammogram for malignant neoplasm of breast: Secondary | ICD-10-CM

## 2021-12-25 DIAGNOSIS — G8929 Other chronic pain: Secondary | ICD-10-CM

## 2021-12-25 NOTE — Progress Notes (Signed)
? ?Acute Office Visit ? ?Subjective:  ? ? Patient ID: Victoria Parsons, female    DOB: 04-23-1961, 61 y.o.   MRN: [id number] ? ?Chief Complaint  ?Patient presents with  ? Hyperlipidemia  ? ? ?Hyperlipidemia ? ?Patient is in today for follow up hyperlipidemia ?Pt presents with hyperlipidemia.Compliance with treatment has been goodThe patient is compliant with medications, maintains a low cholesterol diet , follows up as directed , and maintains an exercise regimen . The patient denies experiencing any hypercholesterolemia related symptoms. Pt currently on lipitor '20mg'$  qd ? ?Pt is currently taking wellbutrin '150mg'$  bid - voices no problems with medication - had been taking to help with smoking cessation and she stopped smoking 5 weeks ago (she actually takes this medication once daily most days) ? ?Pt with history of breast cancer - she currently follows with oncology every 6 months - Dr Hinton Rao - symptoms have been stable - she is currently taking Arimidex-  ? ?Pt with history of hypercalcemia - has seen endocrinologist and currently is not doing further testing - is monitoring levels at this time and has a follow up appt next month ? ?Pt has lesion just above her lip that is new - would like dermatology referral ? ?Pt complains of chronic right hip pain for years - states mostly hurts with long periods of walking and then can have radiating pain down leg -  ?Cannot recall any specific injury or trauma ?Would like xray done ? ?Pt would like prevnar 20 today ? ? ?Past Medical History:  ?Diagnosis Date  ? Breast cancer   ? on Anastrazole - 5 years will be fall 2023  ? 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 Hypercalcemia   ? Hypercholesterolemia   ? Osteopenia 04/19/2019  ? Sixth (abducent) nerve palsy, right eye 2019  ? from TIA in 2019 is now resolved  ? Tobacco abuse   ? quit 11/17/21 and takes wellbutrin while stopping smoking  ? ? ?Past Surgical History:  ?Procedure Laterality Date  ? BREAST  LUMPECTOMY Left 2014  ? benign  ? BREAST SURGERY Left 2018  ? CA  ? CERVICAL CONE BIOPSY N/A 11/2017  ? CERVICAL CONE BIOPSY N/A 11/2019  ? pilonidal cyst N/A   ? procedure in high school  ? ROBOTIC ASSISTED TOTAL HYSTERECTOMY WITH BILATERAL SALPINGO OOPHERECTOMY Bilateral 12/09/2021  ? Procedure: XI ROBOTIC ASSISTED TOTAL HYSTERECTOMY WITH BILATERAL SALPINGO OOPHORECTOMY;  Surgeon: Lafonda Mosses, MD;  Location: [facility];  Service: Gynecology;  Laterality: Bilateral;  ? TONSILLECTOMY Right   ? as a child  ? ? ?Family History  ?Problem Relation Age of Onset  ? Hypertension Mother   ? Hyperlipidemia Mother   ? Diabetes Mother   ? Arthritis Mother   ? Alzheimer's disease Mother   ? Osteoarthritis Mother   ? Hypertension Father   ? Hyperlipidemia Father   ? Diabetes Father   ? Coronary artery disease Father   ? Hypothyroidism Sister   ? Anxiety disorder Sister   ? Depression Sister   ? Breast cancer Paternal Aunt   ? Breast cancer Maternal Grandmother   ? Breast cancer Cousin   ? Hyperparathyroidism Neg Hx   ? Pancreatic cancer Neg Hx   ? Prostate cancer Neg Hx   ? Colon cancer Neg Hx   ? Ovarian cancer Neg Hx   ? Endometrial cancer Neg Hx   ? ? ?Social History  ? ?Socioeconomic History  ? Marital status: Married  ?  Spouse name: Not on file  ? Number of children: 1  ? Years of education: Not on file  ? Highest education level: Not on file  ?Occupational History  ?  Employer: [employer]  ?Tobacco Use  ? Smoking status: Former  ?  Packs/day: 0.50  ?  Years: 40.00  ?  Pack years: 20.00  ?  Types: Cigarettes  ?  Quit date: 11/17/2021  ?  Years since quitting: 0.1  ? Smokeless tobacco: Never  ?Vaping Use  ? Vaping Use: Never used  ?Substance and Sexual Activity  ? Alcohol use: Not Currently  ? Drug use: Never  ? Sexual activity: Not Currently  ?Other Topics Concern  ? Not on file  ?Social History Narrative  ? Not on file  ? ?Social Determinants of Health  ? ?Financial Resource Strain: Not on file  ?Food Insecurity: Not on  file  ?Transportation Needs: Not on file  ?Physical Activity: Not on file  ?Stress: Not on file  ?Social Connections: Not on file  ?Intimate Partner Violence: Not on file  ? ? ? ?Current Outpatient Medications:  ?  anastrozole (ARIMIDEX) 1 MG tablet, Take 1 mg by mouth daily., Disp: , Rfl:  ?  atorvastatin (LIPITOR) 20 MG tablet, TAKE ONE (1) TABLET BY MOUTH EVERY DAY, Disp: 90 tablet, Rfl: 1 ?  Bilberry, Vaccinium myrtillus, (BILBERRY EXTRACT PO), Take 1 capsule by mouth daily., Disp: , Rfl:  ?  buPROPion (WELLBUTRIN SR) 150 MG 12 hr tablet, TAKE 1 TABLET BY  MOUTH TWICE DAILY (Patient taking differently: 150 mg daily.), Disp: 180 tablet, Rfl: 1 ?  cholecalciferol (VITAMIN D3) 25 MCG (1000 UNIT) tablet, Take 1,000 Units by mouth daily., Disp: , Rfl:  ?  loratadine (CLARITIN) 10 MG tablet, Take 10 mg by mouth at bedtime. , Disp: , Rfl:  ?  LUTEIN PO, Take 1 capsule by mouth daily., Disp: , Rfl:  ?  magic mouthwash (nystatin, lidocaine, diphenhydrAMINE, alum & mag hydroxide) suspension, Swish and swallow 5 mLs 3 (three) times daily as needed for mouth pain., Disp: 180 mL, Rfl: 0 ?  sodium chloride (OCEAN) 0.65 % SOLN nasal spray, Place 1 spray into both nostrils as needed for congestion., Disp: , Rfl:  ?  VITAMIN E EX, Place 200 Units vaginally 2 (two) times a week., Disp: , Rfl:   ? ?Allergies  ?Allergen Reactions  ? Codeine Nausea Only  ? Penicillins Other (See Comments)  ?  DID THE REACTION INVOLVE: Swelling of the face/tongue/throat, SOB, or low BP? Yes ?Sudden or severe rash/hives, skin peeling, or the inside of the mouth or nose? Yes ?Did it require medical treatment? Yes ?When did it last happen? 15-20 YRS AGO    ?If all above answers are "NO", may proceed with cephalosporin use. ?  ?CONSTITUTIONAL: Negative for chills, fatigue, fever, unintentional weight gain and unintentional weight loss.  ?E/N/T: Negative for ear pain, nasal congestion and sore throat.  ?CARDIOVASCULAR: Negative for chest pain, dizziness,  palpitations and pedal edema.  ?RESPIRATORY: Negative for recent cough and dyspnea.  ?GASTROINTESTINAL: Negative for abdominal pain, acid reflux symptoms, constipation, diarrhea, nausea and vomiting.  ?MSK:see HPI ?INTEGUMENTARY: Negative for rash.  ?NEUROLOGICAL: Negative for dizziness and headaches.  ?PSYCHIATRIC: Negative for sleep disturbance and to question depression screen.  Negative for depression, negative for anhedonia.  ?   ? ? ?Objective:  ?PHYSICAL EXAM:  ? ?VS: BP 130/82 (BP Location: Left Arm, Patient Position: Sitting)   Pulse 72   Temp 97.8 ?F (36.6 ?  C) (Oral)   Ht '5\' 2"'$  (1.575 m)   Wt 177 lb (80.3 kg)   SpO2 98%   BMI 32.37 kg/m?  ? ?GEN: Well nourished, well developed, in no acute distress  ?Cardiac: RRR; no murmurs, rubs, or gallops,no edema - ?Respiratory:  normal respiratory rate and pattern with no distress - normal breath sounds with no rales, rhonchi, wheezes or rubs ?GI: normal bowel sounds, no masses or tenderness ?MS: no deformity or atrophy  ?Skin: has small lesion above lip -atypical ?Neuro:  Alert and Oriented x 3, Strength and sensation are intact - CN II-Xii grossly intact ?Psych: euthymic mood, appropriate affect and demeanor ? ? ? ?Health Maintenance Due  ?Topic Date Due  ? MAMMOGRAM  12/23/2021  ? ? ?There are no preventive care reminders to display for this patient. ? ? ? ? ?   ?Assessment & Plan:  ? ?Problem List Items Addressed This Visit   ? ?  ? Other  ? Mixed hyperlipidemia - Primary  ? Relevant Orders  ? CBC with Differential/Platelet  ? Comprehensive metabolic panel  ? Lipid panel ?Continue meds as directed and watch diet ?  ?   ? Infiltrating ductal carcinoma of left breast ([place]) ?Continue meds and follow up with oncology as directed  ?   ? Hypercalcemia ?Continue follow up with endocrinologist ? ?Lesion on lip ?Referral to dermatology ? ?Right hip pain ?Xray ordered ? ?Need for pneumonia shot ?Prevnar 20 given today  ?   ?   ? ?  ? ? ? ?No orders of the defined  types were placed in this encounter. ? ? ? ?SARA R Osvaldo Lamping, PA-C ?

## 2021-12-26 ENCOUNTER — Other Ambulatory Visit: Payer: Self-pay | Admitting: Physician Assistant

## 2021-12-26 DIAGNOSIS — E782 Mixed hyperlipidemia: Secondary | ICD-10-CM

## 2021-12-26 LAB — CBC WITH DIFFERENTIAL/PLATELET
Basophils Absolute: 0.1 10*3/uL (ref 0.0–0.2)
Basos: 1 %
EOS (ABSOLUTE): 0.3 10*3/uL (ref 0.0–0.4)
Eos: 4 %
Hematocrit: 45.4 % (ref 34.0–46.6)
Hemoglobin: 15 g/dL (ref 11.1–15.9)
Immature Grans (Abs): 0 10*3/uL (ref 0.0–0.1)
Immature Granulocytes: 0 %
Lymphocytes Absolute: 1.8 10*3/uL (ref 0.7–3.1)
Lymphs: 23 %
MCH: 30.8 pg (ref 26.6–33.0)
MCHC: 33 g/dL (ref 31.5–35.7)
MCV: 93 fL (ref 79–97)
Monocytes Absolute: 0.6 10*3/uL (ref 0.1–0.9)
Monocytes: 7 %
Neutrophils Absolute: 5.2 10*3/uL (ref 1.4–7.0)
Neutrophils: 65 %
Platelets: 285 10*3/uL (ref 150–450)
RBC: 4.87 x10E6/uL (ref 3.77–5.28)
RDW: 12.6 % (ref 11.7–15.4)
WBC: 8.1 10*3/uL (ref 3.4–10.8)

## 2021-12-26 LAB — LIPID PANEL
Chol/HDL Ratio: 4.9 ratio — ABNORMAL HIGH (ref 0.0–4.4)
Cholesterol, Total: 220 mg/dL — ABNORMAL HIGH (ref 100–199)
HDL: 45 mg/dL (ref >39)
LDL Chol Calc (NIH): 141 mg/dL — ABNORMAL HIGH (ref 0–99)
Triglycerides: 186 mg/dL — ABNORMAL HIGH (ref 0–149)
VLDL Cholesterol Cal: 34 mg/dL (ref 5–40)

## 2021-12-26 LAB — COMPREHENSIVE METABOLIC PANEL
ALT: 36 IU/L — ABNORMAL HIGH (ref 0–32)
AST: 23 IU/L (ref 0–40)
Albumin/Globulin Ratio: 1.7 (ref 1.2–2.2)
Albumin: 4.4 g/dL (ref 3.8–4.9)
Alkaline Phosphatase: 171 IU/L — ABNORMAL HIGH (ref 44–121)
BUN/Creatinine Ratio: 17 (ref 12–28)
BUN: 18 mg/dL (ref 8–27)
Bilirubin Total: 0.2 mg/dL (ref 0.0–1.2)
CO2: 21 mmol/L (ref 20–29)
Calcium: 11.3 mg/dL — ABNORMAL HIGH (ref 8.7–10.3)
Chloride: 104 mmol/L (ref 96–106)
Creatinine, Ser: 1.08 mg/dL — ABNORMAL HIGH (ref 0.57–1.00)
Globulin, Total: 2.6 g/dL (ref 1.5–4.5)
Glucose: 93 mg/dL (ref 70–99)
Potassium: 5.1 mmol/L (ref 3.5–5.2)
Sodium: 141 mmol/L (ref 134–144)
Total Protein: 7 g/dL (ref 6.0–8.5)
eGFR: 59 mL/min/{1.73_m2} — ABNORMAL LOW (ref >59)

## 2021-12-26 LAB — B12 AND FOLATE PANEL
Folate: 3.2 ng/mL (ref >3.0)
Vitamin B-12: 289 pg/mL (ref 232–1245)

## 2021-12-26 LAB — CARDIOVASCULAR RISK ASSESSMENT

## 2021-12-26 LAB — TSH: TSH: 1.52 u[IU]/mL (ref 0.450–4.500)

## 2021-12-26 LAB — VITAMIN D 25 HYDROXY (VIT D DEFICIENCY, FRACTURES): Vit D, 25-Hydroxy: 55.2 ng/mL (ref 30.0–100.0)

## 2021-12-26 MED ORDER — ATORVASTATIN CALCIUM 40 MG PO TABS: 40.0000 mg | ORAL_TABLET | Freq: Every day | ORAL | 1 refills | Status: DC

## 2021-12-29 ENCOUNTER — Telehealth: Payer: Self-pay

## 2021-12-29 ENCOUNTER — Telehealth: Payer: Self-pay | Admitting: *Deleted

## 2021-12-29 ENCOUNTER — Other Ambulatory Visit: Payer: Self-pay | Admitting: Legal Medicine

## 2021-12-29 ENCOUNTER — Other Ambulatory Visit: Payer: Self-pay | Admitting: Gynecologic Oncology

## 2021-12-29 ENCOUNTER — Encounter: Payer: Self-pay | Admitting: Physician Assistant

## 2021-12-29 DIAGNOSIS — B370 Candidal stomatitis: Secondary | ICD-10-CM

## 2021-12-29 DIAGNOSIS — B3731 Acute candidiasis of vulva and vagina: Secondary | ICD-10-CM

## 2021-12-29 MED ORDER — FLUCONAZOLE 100 MG PO TABS: 100.0000 mg | ORAL_TABLET | Freq: Once | ORAL | 2 refills | Status: AC

## 2021-12-29 MED ORDER — FLUCONAZOLE 150 MG PO TABS: 150.0000 mg | ORAL_TABLET | Freq: Once | ORAL | 0 refills | Status: AC

## 2021-12-29 NOTE — Telephone Encounter (Signed)
Following up with Ms. Putnam this afternoon. Patient reports the magic mouthwash prescribed for her did help. She ran out of it on Thursday. She started to notice the symptoms return this morning. She endorses burning and pain when eating.  She reports vaginal itching and burning that comes and goes. Vaginal burning is worse after wiping. She denies  burning with urination.  She reports a "minor" amount of thick white vaginal discharge.  ? ?Joylene John, NP notified. Per NP advised patient that a one time dose of diflucan will be called in for her vaginal symptoms. Patient needs to follow up with her PCP for oral symptoms. Patient verbalized understanding. Instructed to call with any questions or concerns.  ?

## 2021-12-29 NOTE — Telephone Encounter (Signed)
Patient calling as she is having ongoing oral yeast symptoms. She was given magic mouthwash prescribed by GYN. She is continuing to have some white spots on tongue, raw feeling, and burning. It is painful to eat. She is unsure of what to do next.  ? ?Can refer to telephone encounter with Gyn today.  ? ?Victoria Parsons 12/29/21 3:20 PM ? ?

## 2021-12-29 NOTE — Telephone Encounter (Signed)
Patient called and stated "I finished the magic mouthwash last week, but I believe the thrush has come back. I am having burning and pain when I eat. I also had vaginal discharge and took the one time dose. It helped, and I have no discharge, but some itching now. But I don't if it is in my head since the oral came back." Explained that the message would be given to [facility] APP and someone from the office will call her back. Patient's phone number and pharmacy verified   ?

## 2021-12-29 NOTE — Telephone Encounter (Signed)
I sent in fluconazole for one week ?lp ?

## 2021-12-30 ENCOUNTER — Other Ambulatory Visit: Payer: Self-pay

## 2021-12-30 ENCOUNTER — Other Ambulatory Visit: Payer: Self-pay | Admitting: Legal Medicine

## 2021-12-30 DIAGNOSIS — B370 Candidal stomatitis: Secondary | ICD-10-CM

## 2021-12-30 MED ORDER — FLUCONAZOLE 100 MG PO TABS: 100.0000 mg | ORAL_TABLET | Freq: Every day | ORAL | 1 refills | Status: DC

## 2021-12-30 NOTE — Telephone Encounter (Signed)
No, she is already aware.  ? ?Victoria Parsons 12/30/21 1:30 PM ? ?

## 2021-12-30 NOTE — Telephone Encounter (Signed)
Patient called for update. Made her aware this was being sent. Has not been sent yet.  ? ?Harrell Lark 12/30/21 9:25 AM ? ?

## 2021-12-31 ENCOUNTER — Ambulatory Visit
Admission: RE | Admit: 2021-12-31 | Discharge: 2021-12-31 | Disposition: A | Discharge status: Home / Self Care | Payer: Managed Care, Other (non HMO) | Source: Ambulatory Visit | Attending: Oncology | Admitting: Oncology

## 2021-12-31 ENCOUNTER — Other Ambulatory Visit: Payer: Self-pay

## 2021-12-31 DIAGNOSIS — Z1231 Encounter for screening mammogram for malignant neoplasm of breast: Secondary | ICD-10-CM

## 2022-01-01 ENCOUNTER — Inpatient Hospital Stay (HOSPITAL_BASED_OUTPATIENT_CLINIC_OR_DEPARTMENT_OTHER): Payer: Managed Care, Other (non HMO) | Admitting: Gynecologic Oncology

## 2022-01-01 VITALS — BP 147/76 | HR 75 | Temp 97.4°F | Resp 18 | Ht 62.6 in | Wt 175.2 lb

## 2022-01-01 DIAGNOSIS — R87613 High grade squamous intraepithelial lesion on cytologic smear of cervix (HGSIL): Secondary | ICD-10-CM

## 2022-01-01 DIAGNOSIS — Z90722 Acquired absence of ovaries, bilateral: Secondary | ICD-10-CM

## 2022-01-01 DIAGNOSIS — Z90710 Acquired absence of both cervix and uterus: Secondary | ICD-10-CM

## 2022-01-01 DIAGNOSIS — N952 Postmenopausal atrophic vaginitis: Secondary | ICD-10-CM

## 2022-01-01 DIAGNOSIS — Z7189 Other specified counseling: Secondary | ICD-10-CM

## 2022-01-01 DIAGNOSIS — Z87410 Personal history of cervical dysplasia: Secondary | ICD-10-CM

## 2022-01-01 NOTE — Progress Notes (Signed)
Gynecologic Oncology Return Clinic Visit ? ?01/01/2022 ? ?Reason for Visit: Follow-up after recent surgery, treatment discussion ? ?Treatment History: ?Notes having cryotherapy about 30 years ago for abnormal cells on her cervix. ?She had normal Pap smears after. ?s/p CKC in 2017 after HSIL pap with negative colposcopy. CKC returned negative. ?Pap in 08/2020: HSIL pap, +HR HPV. ?Colposcopy in 2021: Biopsies negative ?CKC in 10/2020. I can't see these records in Care Everywhere, but the patient reports final results were negative for dysplasia. ?Pap on 09/10/2021: H SIL.  High risk HPV positive ?Colposcopy on 10/17/2021: Cervical biopsy from 8:00 shows CIN-1 with HPV effect.  ECC shows small fragments of detached squamous mucosa with features consistent with high-grade dysplasia.  Numerous fragments of squamous mucosa with low-grade intraepithelial lesion also noted. ? ?12/09/21: TRH/BSO ? ?Interval History: ?Patient reports overall doing well since surgery.  She had some symptoms consistent with a yeast infection and has had significant improvement after treatment.  She had recurrence of her symptoms about a week after her initial treatment and has recently taken a second dose of Diflucan.  She denies any urinary symptoms, reports regular bowel function.  Denies any vaginal bleeding or discharge.  Has some pelvic pressure, improving with time since surgery, feels mostly when she is sitting for longer periods of time. ? ?Past Medical/Surgical History: ?Past Medical History:  ?Diagnosis Date  ? Breast cancer   ? on Anastrazole - 5 years will be fall 2023  ? 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 Hypercalcemia   ? Hypercholesterolemia   ? Osteopenia 04/19/2019  ? Sixth (abducent) nerve palsy, right eye 2019  ? from TIA in 2019 is now resolved  ? Tobacco abuse   ? quit 11/17/21 and takes wellbutrin while stopping smoking  ? ? ?Past Surgical History:  ?Procedure Laterality Date  ? BREAST LUMPECTOMY Left 2014   ? benign  ? BREAST SURGERY Left 2018  ? CA  ? CERVICAL CONE BIOPSY N/A 11/2017  ? CERVICAL CONE BIOPSY N/A 11/2019  ? pilonidal cyst N/A   ? procedure in high school  ? ROBOTIC ASSISTED TOTAL HYSTERECTOMY WITH BILATERAL SALPINGO OOPHERECTOMY Bilateral 12/09/2021  ? Procedure: XI ROBOTIC ASSISTED TOTAL HYSTERECTOMY WITH BILATERAL SALPINGO OOPHORECTOMY;  Surgeon: Lafonda Mosses, MD;  Location: [facility];  Service: Gynecology;  Laterality: Bilateral;  ? TONSILLECTOMY Right   ? as a child  ? ? ?Family History  ?Problem Relation Age of Onset  ? Hypertension Mother   ? Hyperlipidemia Mother   ? Diabetes Mother   ? Arthritis Mother   ? Alzheimer's disease Mother   ? Osteoarthritis Mother   ? Hypertension Father   ? Hyperlipidemia Father   ? Diabetes Father   ? Coronary artery disease Father   ? Hypothyroidism Sister   ? Anxiety disorder Sister   ? Depression Sister   ? Breast cancer Paternal Aunt   ? Breast cancer Maternal Grandmother   ? Breast cancer Cousin   ? Hyperparathyroidism Neg Hx   ? Pancreatic cancer Neg Hx   ? Prostate cancer Neg Hx   ? Colon cancer Neg Hx   ? Ovarian cancer Neg Hx   ? Endometrial cancer Neg Hx   ? ? ?Social History  ? ?Socioeconomic History  ? Marital status: Married  ?  Spouse name: Not on file  ? Number of children: 1  ? Years of education: Not on file  ? Highest education level: Not on file  ?Occupational History  ?  Employer: [employer]  ?Tobacco Use  ? Smoking status: Former  ?  Packs/day: 0.50  ?  Years: 40.00  ?  Pack years: 20.00  ?  Types: Cigarettes  ?  Quit date: 11/17/2021  ?  Years since quitting: 0.1  ? Smokeless tobacco: Never  ?Vaping Use  ? Vaping Use: Never used  ?Substance and Sexual Activity  ? Alcohol use: Not Currently  ? Drug use: Never  ? Sexual activity: Not Currently  ?Other Topics Concern  ? Not on file  ?Social History Narrative  ? Not on file  ? ?Social Determinants of Health  ? ?Financial Resource Strain: Not on file  ?Food Insecurity: Not on file  ?Transportation  Needs: Not on file  ?Physical Activity: Not on file  ?Stress: Not on file  ?Social Connections: Not on file  ? ? ?Current Medications: ? ?Current Outpatient Medications:  ?  Bilberry, Vaccinium myrtillus, (BILBERRY EXTRACT PO), Take 1 capsule by mouth daily., Disp: , Rfl:  ?  buPROPion (WELLBUTRIN SR) 150 MG 12 hr tablet, TAKE 1 TABLET BY  MOUTH TWICE DAILY (Patient taking differently: 150 mg daily.), Disp: 180 tablet, Rfl: 1 ?  cholecalciferol (VITAMIN D3) 25 MCG (1000 UNIT) tablet, Take 1,000 Units by mouth daily., Disp: , Rfl:  ?  loratadine (CLARITIN) 10 MG tablet, Take 10 mg by mouth at bedtime. , Disp: , Rfl:  ?  LUTEIN PO, Take 1 capsule by mouth daily., Disp: , Rfl:  ?  magic mouthwash (nystatin, lidocaine, diphenhydrAMINE, alum & mag hydroxide) suspension, Swish and swallow 5 mLs 3 (three) times daily as needed for mouth pain., Disp: 180 mL, Rfl: 0 ?  sodium chloride (OCEAN) 0.65 % SOLN nasal spray, Place 1 spray into both nostrils as needed for congestion., Disp: , Rfl:  ?  VITAMIN E EX, Place 200 Units vaginally 2 (two) times a week., Disp: , Rfl:  ?  anastrozole (ARIMIDEX) 1 MG tablet, Take 1 mg by mouth daily., Disp: , Rfl:  ?  atorvastatin (LIPITOR) 40 MG tablet, Take 1 tablet (40 mg total) by mouth daily., Disp: 90 tablet, Rfl: 1 ?  fluconazole (DIFLUCAN) 100 MG tablet, Take 1 tablet (100 mg total) by mouth daily., Disp: 7 tablet, Rfl: 1 ? ?Review of Systems: ?Denies appetite changes, fevers, chills, fatigue, unexplained weight changes. ?Denies hearing loss, neck lumps or masses, mouth sores, ringing in ears or voice changes. ?Denies cough or wheezing.  Denies shortness of breath. ?Denies chest pain or palpitations. Denies leg swelling. ?Denies abdominal distention, pain, blood in stools, constipation, diarrhea, nausea, vomiting, or early satiety. ?Denies pain with intercourse, dysuria, frequency, hematuria or incontinence. ?Denies hot flashes, vaginal bleeding or vaginal discharge.   ?Denies joint  pain, back pain or muscle pain/cramps. ?Denies itching, rash, or wounds. ?Denies dizziness, headaches, numbness or seizures. ?Denies swollen lymph nodes or glands, denies easy bruising or bleeding. ?Denies anxiety, depression, confusion, or decreased concentration. ? ?Physical Exam: ?BP (!) 147/76 (BP Location: Left Arm, Patient Position: Sitting)   Pulse 75   Temp (!) 97.4 ?F (36.3 ?C) (Oral)   Resp 18   Ht 5' 2.6" (1.59 m)   Wt 175 lb 3.2 oz (79.5 kg)   SpO2 100%   BMI 31.44 kg/m?  ?General: Alert, oriented, no acute distress. ?HEENT: Normocephalic, atraumatic, sclera anicteric. ?Chest: Unlabored breathing on room air. ?Abdomen: soft, nontender.  Normoactive bowel sounds.  No masses or hepatosplenomegaly appreciated.  Well-healed incisions. ?Extremities: Grossly normal range of motion.  Warm, well perfused.  No edema bilaterally. ?GU: Normal appearing external genitalia without erythema, excoriation, or lesions.  Speculum exam reveals moderately atrophic vaginal mucosa, no bleeding or discharge, cuff intact with suture visible.  Bimanual exam reveals cuff intact, no fluctuance or tenderness to palpation.   ? ?Laboratory & Radiologic Studies: ?A. UTERUS, CERVIX, BILATERAL FALLOPIAN TUBES AND OVARIES:  ?Chronic cervicitis with squamous metaplasia  ?Negative for residual dysplasia (see comment)  ?Cervical transformation zone changes consistent with prior procedure  ?Benign leiomyomata, intramural and subserosal, measuring up to 0.8 cm in  ?greatest dimension  ?Benign inactive to atrophic endometrium with cystic change  ?Benign fallopian tubes and ovaries  ?Benign cystic [place] rest of the left fallopian tube  ? ?COMMENT:  ? ?The entire cervix is submitted and much of the epithelium is denuded.  ?That which remains appears markedly atrophic with crowded hyperchromatic  ?nuclei and a patchy inflammatory cell infiltrate.  Two  ?immunohistochemical stains are performed with adequate control on two   ?representative sections of the cervix.  The HPV surrogate marker p16 is  ?negative within the epithelium present within the sections and the  ?proliferation marker Ki-67 reveals no increased proliferation.  This  ?immunoh

## 2022-01-01 NOTE — Patient Instructions (Signed)
It was good to see you today.  You are healing well from surgery.  Remember no heavy lifting for 6 weeks after surgery and nothing in the vagina for at least 8 weeks. ? ?We discussed the possibility of vaginal estrogen use.  It is likely that your medical oncologist will recommend against considering this.  Please let me know after your visit next week. ? ?From a follow-up standpoint, I would recommend yearly follow-up with your OB/GYN for an exam and Pap test. ? ?Please do not hesitate to call and let me know if you need anything in the future. ?

## 2022-01-07 NOTE — Progress Notes (Signed)
?[facility]  ?[address] ?[place],    [address] ?[phone] ? ?Clinic Day:  01/08/22 ? ?Referring physician: Marge Duncans, PA-C ? ?CHIEF COMPLAINT:  ?CC: Stage IIA left breast cancer ? ?Current Treatment:  Hormonal therapy for a total of 5 years; currently on anastrozole 1 mg daily ? ? ?HISTORY OF PRESENT ILLNESS:  ?Victoria Parsons is a 61 y.o. female with a history  of stage IIA (T2 N0 M0) hormone receptor positive left breast cancer diagnosed in March 2018.  She was treated with lumpectomy in April.  Pathology report revealed a 25 mm, grade 2, invasive ductal carcinoma with clear margins and 1 sentinel node negative for metastasis.  Estrogen and progesterone receptors were positive with HER 2 Neu negative.  Ki 67 was 15%.  EndoPredict testing  revealed a score of 3.3, which is in the low risk category, and corresponds with a 9.8% risk of recurrence in the next 10 years with hormonal therapy, so chemotherapy was not felt to be beneficial.  She received adjuvant radiation to the left breast completed in August.  Bone density scan as a baseline in June 2018 was normal.  She is postmenopausal having had her last menses at least 10 years ago.  She was placed on anastrozole 1 mg daily at the end of August 2018. Bilateral screening mammogram in March 2019 did not reveal any evidence of malignancy.  She had a TIA on Christmas Eve 2019, and had double vision for weeks afterwards.  She is now on aspirin 81 mg daily in addition to her usual medications.  She underwent a bone density scan back in July 2020 which revealed mild osteopenia of the hip and spine.  T-score of -1.4 of the right femur neck, previously -0.9.  Dual femur total mean is normal at -0.9, previously -0.2.  AP spine measures -1.1, previously -0.8, and is considered osteopenic. She will need to remain on calcium and vitamin D supplement. ? ?INTERVAL HISTORY:  ?Victoria Parsons is here for routine follow up and states that  she is well and denies complaints.  She had her hysterectomy 4 weeks ago which revealed chronic cervicitis but benign histology with no dysplasia.  She is asking whether could she could safely use Premarin cream for atrophic vaginitis, and I told her that would be fine but I would not recommend systemic estrogen.  Annual bilateral mammogram from March 2023 was clear.  Recent labs from March 2023 revealing normal CBC, creatinine of 1.08 and elevated calcium of 11.3.  The rest is normal she continues anastrozole 1 mg daily without difficulty. Marge Duncans, PA-C obtains routine lab work.  She had her bone density in July of 2022 and this is relatively stable.  The femur reveals osteopenia which is stable and the spine just slight worsening of the osteopenia.  She has elevated calcium and so takes vitamin D only.  They will plan to repeat that in 2 years..  Her  appetite is good, and her weight is stable since her last visit.  She denies fever, chills or other signs of infection.  She denies nausea, vomiting, bowel issues, or abdominal pain.  She denies sore throat, cough, dyspnea, or chest pain. ? ?REVIEW OF SYSTEMS:  ?Review of Systems  ?Constitutional: Negative.  Negative for appetite change, chills, fatigue, fever and unexpected weight change.  ?HENT:  Negative.    ?Eyes: Negative.   ?Respiratory: Negative.  Negative for chest tightness, cough, hemoptysis, shortness of breath and wheezing.   ?  Cardiovascular: Negative.  Negative for chest pain, leg swelling and palpitations.  ?Gastrointestinal: Negative.  Negative for abdominal distention, abdominal pain, blood in stool, constipation, diarrhea, nausea and vomiting.  ?Endocrine: Negative.   ?Genitourinary: Negative.  Negative for difficulty urinating, dysuria, frequency and hematuria.   ?Musculoskeletal: Negative.  Negative for arthralgias, back pain, flank pain, gait problem and myalgias.  ?Skin: Negative.   ?Neurological: Negative.  Negative for dizziness, extremity  weakness, gait problem, headaches, light-headedness, numbness, seizures and speech difficulty.  ?Hematological: Negative.   ?Psychiatric/Behavioral: Negative.  Negative for depression and sleep disturbance. The patient is not nervous/anxious.    ? ?VITALS:  ?Blood pressure (!) 143/88, pulse 69, temperature 97.9 ?F (36.6 ?C), temperature source Oral, resp. rate 18, height 5' 2.5" (1.588 m), weight 176 lb 9.6 oz (80.1 kg), SpO2 95 %.  ?Wt Readings from Last 3 Encounters:  ?01/08/22 176 lb 9.6 oz (80.1 kg)  ?01/01/22 175 lb 3.2 oz (79.5 kg)  ?12/25/21 177 lb (80.3 kg)  ?  ?Body mass index is 31.79 kg/m?. ? ?Performance status (ECOG): 0 - Asymptomatic ? ?PHYSICAL EXAM:  ?Physical Exam ?Constitutional:   ?   General: She is not in acute distress. ?   Appearance: Normal appearance. She is normal weight.  ?HENT:  ?   Head: Normocephalic and atraumatic.  ?Eyes:  ?   General: No scleral icterus. ?   Extraocular Movements: Extraocular movements intact.  ?   Conjunctiva/sclera: Conjunctivae normal.  ?   Pupils: Pupils are equal, round, and reactive to light.  ?Cardiovascular:  ?   Rate and Rhythm: Normal rate and regular rhythm.  ?   Pulses: Normal pulses.  ?   Heart sounds: Normal heart sounds. No murmur heard. ?  No friction rub. No gallop.  ?Pulmonary:  ?   Effort: Pulmonary effort is normal. No respiratory distress.  ?   Breath sounds: Normal breath sounds.  ?Chest:  ?Breasts: ?   Right: Normal. No mass.  ?   Left: Normal. No mass.  ?   Comments: She has a well healed scar along the superior edge of the areolar complex of the left breast and some nodularity above that.  Both breasts are without masses. ?Abdominal:  ?   General: Bowel sounds are normal. There is no distension.  ?   Palpations: Abdomen is soft. There is no hepatomegaly, splenomegaly or mass.  ?   Tenderness: There is no abdominal tenderness.  ?Musculoskeletal:     ?   General: Normal range of motion.  ?   Cervical back: Normal range of motion and neck  supple.  ?   Right lower leg: No edema.  ?   Left lower leg: No edema.  ?Lymphadenopathy:  ?   Cervical: No cervical adenopathy.  ?Skin: ?   General: Skin is warm and dry.  ?Neurological:  ?   General: No focal deficit present.  ?   Mental Status: She is alert and oriented to person, place, and time. Mental status is at baseline.  ?Psychiatric:     ?   Mood and Affect: Mood normal.     ?   Behavior: Behavior normal.     ?   Thought Content: Thought content normal.     ?   Judgment: Judgment normal.  ? ? ?LABS:  ? ? ?  Latest Ref Rng & Units 12/25/2021  ?  9:49 AM 11/28/2021  ?  1:30 PM 06/26/2021  ?  9:02 AM  ?CBC  ?WBC 3.4 -  10.8 x10E3/uL 8.1   7.9   7.1    ?Hemoglobin 11.1 - 15.9 g/dL 15.0   15.2   15.8    ?Hematocrit 34.0 - 46.6 % 45.4   45.8   46.9    ?Platelets 150 - 450 x10E3/uL 285   257   276    ? ? ?  Latest Ref Rng & Units 12/25/2021  ?  9:49 AM 11/28/2021  ?  1:30 PM 06/26/2021  ?  9:02 AM  ?CMP  ?Glucose 70 - 99 mg/dL 93   84   98    ?BUN 8 - 27 mg/dL '18   19   15    '$ ?Creatinine 0.57 - 1.00 mg/dL 1.08   0.99   1.18    ?Sodium 134 - 144 mmol/L 141   137   142    ?Potassium 3.5 - 5.2 mmol/L 5.1   4.1   4.8    ?Chloride 96 - 106 mmol/L 104   106   105    ?CO2 20 - 29 mmol/L '21   22   21    '$ ?Calcium 8.7 - 10.3 mg/dL 11.3   10.4   11.1    ?Total Protein 6.0 - 8.5 g/dL 7.0   7.2   6.9    ?Total Bilirubin 0.0 - 1.2 mg/dL 0.2   0.6   0.3    ?Alkaline Phos 44 - 121 IU/L 171   137   171    ?AST 0 - 40 IU/L '23   25   21    '$ ?ALT 0 - 32 IU/L 36   37   24    ? ? ?STUDIES:  ? ? EXAM: 04/09/21 ?DUAL X-RAY ABSORPTIOMETRY (DXA) FOR BONE MINERAL DENSITY  ?IMPRESSION:  ?Victoria Parsons completed a BMD test on 04/09/2021 using the [employer]ar iDXA  ?DXA System (analysis version: 13.60) manufactured by [employer].  ?The following summarizes the results of our evaluation.  ?Technologist: [facility]  ?PATIENT BIOGRAPHICAL:  ?Name: Victoria, Parsons  ?Patient ID: [id number] [place] Birth Date: 22-Dec-1960 Height: 62.5 in.  ?Gender: Female Exam Date: 04/09/2021  Weight: 175.0 lbs.  ?Indications: Family Hist. (Parent hip fracture) Fractures:  ?Treatments:  ?ASSESSMENT:  ?The BMD measured at AP Spine L1-L4 is 0.991 g/cm2 with a T-score of  ?-1.6. This patient is consider

## 2022-01-08 ENCOUNTER — Encounter: Payer: Self-pay | Admitting: Oncology

## 2022-01-08 ENCOUNTER — Inpatient Hospital Stay: Payer: Managed Care, Other (non HMO) | Attending: Gynecologic Oncology | Admitting: Oncology

## 2022-01-08 ENCOUNTER — Other Ambulatory Visit: Payer: Self-pay | Admitting: Oncology

## 2022-01-08 ENCOUNTER — Telehealth: Payer: Self-pay

## 2022-01-08 VITALS — BP 143/88 | HR 69 | Temp 97.9°F | Resp 18 | Ht 62.5 in | Wt 176.6 lb

## 2022-01-08 DIAGNOSIS — N952 Postmenopausal atrophic vaginitis: Secondary | ICD-10-CM | POA: Insufficient documentation

## 2022-01-08 DIAGNOSIS — C50912 Malignant neoplasm of unspecified site of left female breast: Secondary | ICD-10-CM

## 2022-01-08 MED ORDER — ESTROGENS CONJUGATED 0.625 MG/GM VA CREA: 1.0000 | TOPICAL_CREAM | Freq: Every day | VAGINAL | 12 refills | Status: DC

## 2022-01-08 NOTE — Telephone Encounter (Signed)
Printed

## 2022-01-08 NOTE — Telephone Encounter (Signed)
-----   Message from Derwood Kaplan, MD sent at 01/07/2022  4:32 PM EDT ----- ?Regarding: mammo ?She was supposed to have mammo, pls check.  Also was due for DEXA this past summer, look for it ? ?

## 2022-01-20 ENCOUNTER — Encounter: Payer: Self-pay | Admitting: Gynecologic Oncology

## 2022-01-20 ENCOUNTER — Encounter: Payer: Self-pay | Admitting: Oncology

## 2022-01-26 ENCOUNTER — Ambulatory Visit (INDEPENDENT_AMBULATORY_CARE_PROVIDER_SITE_OTHER): Payer: Managed Care, Other (non HMO) | Admitting: Endocrinology

## 2022-01-26 VITALS — BP 128/84 | HR 73 | Ht 62.5 in | Wt 181.8 lb

## 2022-01-26 DIAGNOSIS — R748 Abnormal levels of other serum enzymes: Secondary | ICD-10-CM | POA: Diagnosis not present

## 2022-01-26 NOTE — Progress Notes (Addendum)
? ?Subjective:  ? ? Patient ID: Victoria Parsons, female    DOB: 15-Jul-1961, 61 y.o.   MRN: [id number] ? ?HPI ?Pt returns for f/u of hypercalcemia/ hyperparathyroidism (dx'ed 2021; she has never had urolithiasis; she was dx'ed with osteopenia in 2022; she has only a few bony fractures as a child--traumatic; 24HR urine Ca++ was 155 mg; Vit-D is normal and PTH was high; AP was also high; she does not take Vit-D supp).  pt states she feels well in general.   ?Past Medical History:  ?Diagnosis Date  ? Breast cancer   ? on Anastrazole - 5 years will be fall 2023  ? 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 Hypercalcemia   ? Hypercholesterolemia   ? Osteopenia 04/19/2019  ? Sixth (abducent) nerve palsy, right eye 2019  ? from TIA in 2019 is now resolved  ? Tobacco abuse   ? quit 11/17/21 and takes wellbutrin while stopping smoking  ? ? ?Past Surgical History:  ?Procedure Laterality Date  ? BREAST LUMPECTOMY Left 2014  ? benign  ? BREAST SURGERY Left 2018  ? CA  ? CERVICAL CONE BIOPSY N/A 11/2017  ? CERVICAL CONE BIOPSY N/A 11/2019  ? pilonidal cyst N/A   ? procedure in high school  ? ROBOTIC ASSISTED TOTAL HYSTERECTOMY WITH BILATERAL SALPINGO OOPHERECTOMY Bilateral 12/09/2021  ? Procedure: XI ROBOTIC ASSISTED TOTAL HYSTERECTOMY WITH BILATERAL SALPINGO OOPHORECTOMY;  Surgeon: Lafonda Mosses, MD;  Location: [facility];  Service: Gynecology;  Laterality: Bilateral;  ? TONSILLECTOMY Right   ? as a child  ? ? ?Social History  ? ?Socioeconomic History  ? Marital status: Married  ?  Spouse name: Not on file  ? Number of children: 1  ? Years of education: Not on file  ? Highest education level: Not on file  ?Occupational History  ?  Employer: [employer]  ?Tobacco Use  ? Smoking status: Former  ?  Packs/day: 0.50  ?  Years: 40.00  ?  Pack years: 20.00  ?  Types: Cigarettes  ?  Quit date: 11/17/2021  ?  Years since quitting: 0.1  ? Smokeless tobacco: Never  ?Vaping Use  ? Vaping Use: Never used  ?Substance and Sexual  Activity  ? Alcohol use: Not Currently  ? Drug use: Never  ? Sexual activity: Not Currently  ?Other Topics Concern  ? Not on file  ?Social History Narrative  ? Not on file  ? ?Social Determinants of Health  ? ?Financial Resource Strain: Not on file  ?Food Insecurity: Not on file  ?Transportation Needs: Not on file  ?Physical Activity: Not on file  ?Stress: Not on file  ?Social Connections: Not on file  ?Intimate Partner Violence: Not on file  ? ? ?Current Outpatient Medications on File Prior to Visit  ?Medication Sig Dispense Refill  ? anastrozole (ARIMIDEX) 1 MG tablet Take 1 mg by mouth daily.    ? atorvastatin (LIPITOR) 40 MG tablet Take 1 tablet (40 mg total) by mouth daily. 90 tablet 1  ? Bilberry, Vaccinium myrtillus, (BILBERRY EXTRACT PO) Take 1 capsule by mouth daily.    ? buPROPion (WELLBUTRIN SR) 150 MG 12 hr tablet TAKE 1 TABLET BY  MOUTH TWICE DAILY (Patient taking differently: 150 mg daily.) 180 tablet 1  ? cholecalciferol (VITAMIN D3) 25 MCG (1000 UNIT) tablet Take 1,000 Units by mouth daily.    ? conjugated estrogens (PREMARIN) vaginal cream Place 1 Applicatorful vaginally daily. 42.5 g 12  ? loratadine (CLARITIN) 10 MG  tablet Take 10 mg by mouth at bedtime.     ? LUTEIN PO Take 1 capsule by mouth daily.    ? sodium chloride (OCEAN) 0.65 % SOLN nasal spray Place 1 spray into both nostrils as needed for congestion.    ? VITAMIN E EX Place 200 Units vaginally 2 (two) times a week.    ? ?No current facility-administered medications on file prior to visit.  ? ? ?Allergies  ?Allergen Reactions  ? Codeine Nausea Only  ? Penicillins Other (See Comments)  ?  DID THE REACTION INVOLVE: Swelling of the face/tongue/throat, SOB, or low BP? Yes ?Sudden or severe rash/hives, skin peeling, or the inside of the mouth or nose? Yes ?Did it require medical treatment? Yes ?When did it last happen? 15-20 YRS AGO    ?If all above answers are "NO", may proceed with cephalosporin use. ?  ? ? ?Family History  ?Problem Relation  Age of Onset  ? Hypertension Mother   ? Hyperlipidemia Mother   ? Diabetes Mother   ? Arthritis Mother   ? Alzheimer's disease Mother   ? Osteoarthritis Mother   ? Hypertension Father   ? Hyperlipidemia Father   ? Diabetes Father   ? Coronary artery disease Father   ? Hypothyroidism Sister   ? Anxiety disorder Sister   ? Depression Sister   ? Breast cancer Paternal Aunt   ? Breast cancer Maternal Grandmother   ? Breast cancer Cousin   ? Hyperparathyroidism Neg Hx   ? Pancreatic cancer Neg Hx   ? Prostate cancer Neg Hx   ? Colon cancer Neg Hx   ? Ovarian cancer Neg Hx   ? Endometrial cancer Neg Hx   ? ? ?BP 128/84 (BP Location: Left Arm, Patient Position: Sitting, Cuff Size: Normal)   Pulse 73   Ht 5' 2.5" (1.588 m)   Wt 181 lb 12.8 oz (82.5 kg)   SpO2 97%   BMI 32.72 kg/m?  ? ? ?Review of Systems ? ?   ?Objective:  ? Physical Exam ?VITAL SIGNS:  See vs page ?GENERAL: no distress ?GAIT: normal and steady. ? ?Lab Results  ?Component Value Date  ? PTH 110 (H) 01/26/2022  ? CALCIUM 11.4 (H) 01/26/2022  ? CAION 1.32 09/29/2018  ? ? ?   ?Assessment & Plan:  ?Elev AP: check isoenzymes.   ?Primary hyperparathyroidism: uncontrolled.  We'll follow.  If Ca++ goes higher, she would become a surgical candidate. ? ? ?

## 2022-01-26 NOTE — Patient Instructions (Addendum)
Blood tests are requested for you today.  We'll let you know about the results.    ?You should have an endocrinology follow-up appointment in 6 months.   ?

## 2022-01-28 LAB — ALKALINE PHOSPHATASE, ISOENZYMES
Alkaline Phosphatase: 165 IU/L — ABNORMAL HIGH (ref 44–121)
BONE FRACTION: 36 % (ref 14–68)
INTESTINAL FRAC.: 3 % (ref 0–18)
LIVER FRACTION: 61 % (ref 18–85)

## 2022-01-28 LAB — SPECIMEN STATUS REPORT

## 2022-01-29 LAB — PTH, INTACT AND CALCIUM
Calcium: 11.4 mg/dL — ABNORMAL HIGH (ref 8.6–10.4)
PTH: 110 pg/mL — ABNORMAL HIGH (ref 16–77)

## 2022-01-29 LAB — ALKALINE PHOSPHATASE, BONE SPECIFIC: ALKALINE PHOSPHATASE, BONE SPECIFIC: 27.2 mcg/L (ref 5.6–29.0)

## 2022-01-30 ENCOUNTER — Other Ambulatory Visit: Payer: Self-pay | Admitting: Endocrinology

## 2022-01-30 DIAGNOSIS — E210 Primary hyperparathyroidism: Secondary | ICD-10-CM | POA: Insufficient documentation

## 2022-02-09 ENCOUNTER — Encounter: Payer: Self-pay | Admitting: Endocrinology

## 2022-02-10 ENCOUNTER — Other Ambulatory Visit: Payer: Self-pay | Admitting: Hematology and Oncology

## 2022-02-11 ENCOUNTER — Other Ambulatory Visit: Payer: Self-pay | Admitting: Hematology and Oncology

## 2022-02-11 MED ORDER — ESTRADIOL 0.1 MG/GM VA CREA: 1.0000 | TOPICAL_CREAM | Freq: Every day | VAGINAL | 12 refills | Status: DC

## 2022-05-01 ENCOUNTER — Ambulatory Visit: Payer: 59 | Admitting: Endocrinology

## 2022-05-06 ENCOUNTER — Other Ambulatory Visit (HOSPITAL_COMMUNITY): Payer: Self-pay | Admitting: Surgery

## 2022-05-06 ENCOUNTER — Other Ambulatory Visit: Payer: Self-pay | Admitting: Surgery

## 2022-05-06 DIAGNOSIS — E210 Primary hyperparathyroidism: Secondary | ICD-10-CM

## 2022-05-21 ENCOUNTER — Ambulatory Visit: Payer: Self-pay | Admitting: Surgery

## 2022-05-21 ENCOUNTER — Ambulatory Visit (HOSPITAL_COMMUNITY)
Admission: RE | Admit: 2022-05-21 | Discharge: 2022-05-21 | Disposition: A | Discharge status: Home / Self Care | Payer: Commercial Managed Care - HMO | Source: Ambulatory Visit | Attending: Surgery | Admitting: Surgery

## 2022-05-21 DIAGNOSIS — E210 Primary hyperparathyroidism: Secondary | ICD-10-CM | POA: Diagnosis present

## 2022-05-21 MED ORDER — TECHNETIUM TC 99M SESTAMIBI - CARDIOLITE
24.2000 | Freq: Once | INTRAVENOUS | Status: AC
  Administered 2022-05-21: 24.2 via INTRAVENOUS

## 2022-05-21 NOTE — Progress Notes (Signed)
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  Will plan to proceed with minimally invasive parathyroidectomy as an outpatient procedure as discussed at the office.  Claiborne Billings - please submit orders to schedulers for them to contact the patient.  tmg  Armandina Gemma, [facility] Office: [phone]

## 2022-05-27 ENCOUNTER — Telehealth: Payer: Self-pay

## 2022-05-27 NOTE — Telephone Encounter (Signed)
Pt was given hormone cream in April 2023 for atrophic vaginitis by Dr Hinton Rao. "I feel topical Premarin cream is safe for her to use". I notified Julie,RPH, of office note (dated 01/08/2022 )indicating that Dr Hinton Rao was in agreement with prescribing Premarin cream for her atrophic vaginitis.

## 2022-06-10 NOTE — Patient Instructions (Addendum)
DUE TO SPACE LIMITATIONS, ONLY TWO VISITORS  (aged 61 and older) ARE ALLOWED TO COME WITH YOU AND STAY IN THE WAITING ROOM DURING YOUR PRE OP AND PROCEDURE.   **NO VISITORS ARE ALLOWED IN THE SHORT STAY AREA OR RECOVERY ROOM!!**  You are not required to quarantine at this time prior to your surgery. However, you must do this: Hand Hygiene often Do NOT share personal items Notify your provider if you are in close contact with someone who has COVID or you develop fever 100.4 or greater, new onset of sneezing, cough, sore throat, shortness of breath or body aches.       Your procedure is scheduled on:  Friday  June 19, 2022  Report to [facility] Main Entrance.  Report to admitting at: 05:15    AM  +++++Call this number if you have any questions or problems the morning of surgery [phone]  Do not eat food :After Midnight the night prior to your surgery/procedure.  After Midnight you may have the following liquids until   04:30  AM DAY OF SURGERY  Clear Liquid Diet Water Black Coffee (sugar ok, NO MILK/CREAM OR CREAMERS)  Tea (sugar ok, NO MILK/CREAM OR CREAMERS) regular and decaf                             Plain Jell-O (NO RED)                                           Fruit ices (not with fruit pulp, NO RED)                                     Popsicles (NO RED)                                                                  Juice: apple, WHITE grape, WHITE cranberry Sports drinks like Gatorade (NO RED)                 FOLLOW BOWEL PREP AND ANY ADDITIONAL PRE OP INSTRUCTIONS YOU RECEIVED FROM YOUR SURGEON'S OFFICE!!!   Oral Hygiene is also important to reduce your risk of infection.        Remember - BRUSH YOUR TEETH THE MORNING OF SURGERY WITH YOUR REGULAR TOOTHPASTE   Take ONLY these medicines the morning of surgery with A SIP OF WATER: Bupropion (Wellbutrin), anastrozole (Arimidex)                 You may not have any metal on your body including hair  pins, jewelry, and body piercing  Do not wear make-up, lotions, powders, perfumes, or deodorant  Do not wear nail polish including gel and S&S, artificial / acrylic nails, or any other type of covering on natural nails including finger and toenails. If you have artificial nails, gel coating, etc., that needs to be removed by a nail salon, Please have this removed prior to surgery. Not doing so may mean that your surgery could be cancelled or delayed if  the Surgeon or anesthesia staff feels like they are unable to monitor you safely.   Do not shave 48 hours prior to surgery to avoid nicks in your skin which may contribute to postoperative infections.   Contacts, Hearing Aids, dentures or bridgework may not be worn into surgery.   DO NOT [place]. PHARMACY WILL DISPENSE MEDICATIONS LISTED ON YOUR MEDICATION LIST TO YOU DURING YOUR ADMISSION [place]!   Patients discharged on the day of surgery will not be allowed to drive home.  Someone NEEDS to stay with you for the first 24 hours after anesthesia.  Special Instructions: Bring a copy of your healthcare power of attorney and living will documents the day of surgery, if you wish to have them scanned into your  Medical Records- EPIC  Please read over the following fact sheets you were given: IF YOU HAVE QUESTIONS ABOUT YOUR PRE-OP INSTRUCTIONS, PLEASE CALL [phone]  ([place])   [place] - Preparing for Surgery Before surgery, you can play an important role.  Because skin is not sterile, your skin needs to be as free of germs as possible.  You can reduce the number of germs on your skin by washing with CHG (chlorahexidine gluconate) soap before surgery.  CHG is an antiseptic cleaner which kills germs and bonds with the skin to continue killing germs even after washing. Please DO NOT use if you have an allergy to CHG or antibacterial soaps.  If your skin becomes reddened/irritated stop using the  CHG and inform your nurse when you arrive at Short Stay. Do not shave (including legs and underarms) for at least 48 hours prior to the first CHG shower.  You may shave your face/neck.  Please follow these instructions carefully:  1.  Shower with CHG Soap the night before surgery and the  morning of surgery.  2.  If you choose to wash your hair, wash your hair first as usual with your normal  shampoo.  3.  After you shampoo, rinse your hair and body thoroughly to remove the shampoo.                             4.  Use CHG as you would any other liquid soap.  You can apply chg directly to the skin and wash.  Gently with a scrungie or clean washcloth.  5.  Apply the CHG Soap to your body ONLY FROM THE NECK DOWN.   Do not use on face/ open                           Wound or open sores. Avoid contact with eyes, ears mouth and genitals (private parts).                       Wash face,  Genitals (private parts) with your normal soap.             6.  Wash thoroughly, paying special attention to the area where your  surgery  will be performed.  7.  Thoroughly rinse your body with warm water from the neck down.  8.  DO NOT shower/wash with your normal soap after using and rinsing off the CHG Soap.            9.  Pat yourself dry with a clean towel.  10.  Wear clean pajamas.            11.  Place clean sheets on your bed the night of your first shower and do not  sleep with pets.  ON THE DAY OF SURGERY : Do not apply any lotions/deodorants the morning of surgery.  Please wear clean clothes to the hospital/surgery center.    FAILURE TO FOLLOW THESE INSTRUCTIONS MAY RESULT IN THE CANCELLATION OF YOUR SURGERY  PATIENT SIGNATURE_________________________________  NURSE SIGNATURE__________________________________  ________________________________________________________________________

## 2022-06-10 NOTE — Progress Notes (Addendum)
COVID Vaccine received:  '[]'$  No '[x]'$  Yes Date of any COVID positive Test in last 90 days: none  PCP - Marge Duncans PA-C [facility] [place], [place] [address]   [phone] (Work)    Cardiologist - n/a Endocrinology-  Philemon Kingdom, MD  ( Previous  Dr. Renato Shin)  Chest x-ray -  EKG -  09-30-2018  epic  Stress Test -  ECHO -  Cardiac Cath -   Pacemaker/ICD device     '[x]'$  N/A Spinal Cord Stimulator:'[x]'$  No '[]'$  Yes      (Remind patient to bring remote DOS) Other Implants:   History of Sleep Apnea? '[x]'$  No '[]'$  Yes   Sleep Study Date:   CPAP used?- '[x]'$  No '[]'$  Yes  (Instruct to bring their mask & Tubing)  Does the patient monitor blood sugar? '[]'$  No '[]'$  Yes  '[x]'$  N/A  Blood Thinner Instructions:  none Aspirin Instructions:  ASA  81 mg   no instructions but patient instructed to call Dr. Gala Lewandowsky office to verify Last Dose:  ERAS Protocol Ordered: '[]'$  No  '[x]'$  Yes  no drink ordered PRE-SURGERY '[]'$  ENSURE  '[]'$  G2   Comments:   Activity level: Patient can climb a flight of stairs without difficulty;  '[x]'$  No CP  '[x]'$  No SOB,    Anesthesia review: HxTIA (2019)  Patient denies shortness of breath, fever, cough and chest pain at PAT appointment.  Patient verbalized understanding and agreement to the Pre-Surgical Instructions that were given to them at this PAT appointment. Patient was also educated of the need to review these PAT instructions again prior to his/her surgery.I reviewed the appropriate phone numbers to call if they have any and questions or concerns.

## 2022-06-12 ENCOUNTER — Encounter (HOSPITAL_COMMUNITY)
Admission: RE | Admit: 2022-06-12 | Discharge: 2022-06-12 | Disposition: A | Discharge status: Home / Self Care | Payer: Commercial Managed Care - HMO | Source: Ambulatory Visit | Attending: Surgery | Admitting: Surgery

## 2022-06-12 ENCOUNTER — Encounter (HOSPITAL_COMMUNITY): Payer: Self-pay | Admitting: Surgery

## 2022-06-12 ENCOUNTER — Encounter (HOSPITAL_COMMUNITY): Payer: Self-pay

## 2022-06-12 ENCOUNTER — Other Ambulatory Visit: Payer: Self-pay

## 2022-06-12 VITALS — BP 142/84 | HR 68 | Temp 97.9°F | Resp 16 | Ht 62.0 in | Wt 193.0 lb

## 2022-06-12 DIAGNOSIS — Z9221 Personal history of antineoplastic chemotherapy: Secondary | ICD-10-CM | POA: Insufficient documentation

## 2022-06-12 DIAGNOSIS — Z01812 Encounter for preprocedural laboratory examination: Secondary | ICD-10-CM | POA: Insufficient documentation

## 2022-06-12 HISTORY — DX: Other complications of anesthesia, initial encounter: T88.59XA

## 2022-06-12 LAB — BASIC METABOLIC PANEL
Anion gap: 5 (ref 5–15)
BUN: 17 mg/dL (ref 8–23)
CO2: 25 mmol/L (ref 22–32)
Calcium: 10.7 mg/dL — ABNORMAL HIGH (ref 8.9–10.3)
Chloride: 108 mmol/L (ref 98–111)
Creatinine, Ser: 0.94 mg/dL (ref 0.44–1.00)
GFR, Estimated: >60 mL/min (ref >60)
Glucose, Bld: 97 mg/dL (ref 70–99)
Potassium: 4.8 mmol/L (ref 3.5–5.1)
Sodium: 138 mmol/L (ref 135–145)

## 2022-06-12 LAB — CBC
HCT: 45.8 % (ref 36.0–46.0)
Hemoglobin: 15 g/dL (ref 12.0–15.0)
MCH: 31.1 pg (ref 26.0–34.0)
MCHC: 32.8 g/dL (ref 30.0–36.0)
MCV: 95 fL (ref 80.0–100.0)
Platelets: 274 10*3/uL (ref 150–400)
RBC: 4.82 MIL/uL (ref 3.87–5.11)
RDW: 12.3 % (ref 11.5–15.5)
WBC: 8.5 10*3/uL (ref 4.0–10.5)
nRBC: 0 % (ref 0.0–0.2)

## 2022-06-12 NOTE — H&P (Signed)
REFERRING PHYSICIAN: Gloris Ham, MD  PROVIDER: Reveca Desmarais Charlotta Newton, MD   Chief Complaint: New Consultation (Primary hyperparathyroidism)  History of Present Illness:  Patient is referred by Dr. Renato Shin for surgical evaluation and recommendations regarding suspected primary hyperparathyroidism. Patient has had a history of hypercalcemia with slowly rising serum calcium levels. Her most recent laboratory studies show a calcium level of 11.4 and an intact PTH level of 110. 25-hydroxy vitamin D was normal at 55.2. 24-hour urine collection for calcium was normal at 155. Patient has had no imaging studies. Patient is symptomatic with mild to moderate fatigue, joint pain involving the hip, and a history of osteopenia. Patient does have a personal history of breast cancer and is currently still on anastrozole. Patient has had no prior head or neck surgery. There is no family history of parathyroid disease or other endocrine neoplasm. Patient presents today to discuss management. She is retired.  Review of Systems: A complete review of systems was obtained from the patient. I have reviewed this information and discussed as appropriate with the patient. See HPI as well for other ROS.  Review of Systems  Constitutional: Positive for malaise/fatigue.  HENT: Negative.  Eyes: Negative.  Respiratory: Negative.  Cardiovascular: Negative.  Gastrointestinal: Negative.  Genitourinary: Negative.  Musculoskeletal: Positive for joint pain.  Skin: Negative.  Neurological: Negative.  Endo/Heme/Allergies: Negative.  Psychiatric/Behavioral: Negative.   Medical History: Past Medical History:  Diagnosis Date  Anxiety  History of cancer  History of stroke   Patient Active Problem List  Diagnosis  Primary hyperparathyroidism (CMS-HCC)   Past Surgical History:  Procedure Laterality Date  HYSTERECTOMY  MASTECTOMY    Allergies  Allergen Reactions  Penicillins Other (See Comments)  and Hives  DID THE REACTION INVOLVE: Swelling of the face/tongue/throat, SOB, or low BP? Yes Sudden or severe rash/hives, skin peeling, or the inside of the mouth or nose? Yes Did it require medical treatment? Yes When did it last happen? 15-20 YRS AGO  If all above answers are "NO", may proceed with cephalosporin use.  UNKNOWN  Codeine Nausea   Current Outpatient Medications on File Prior to Visit  Medication Sig Dispense Refill  anastrozole (ARIMIDEX) 1 mg tablet 1 tablet every day by oral route.  atorvastatin (LIPITOR) 20 MG tablet 1 tablet every day by oral route.  buPROPion (WELLBUTRIN SR) 150 MG SR tablet Take 1 tablet by mouth 2 (two) times daily  cholecalciferol (VITAMIN D3) 5,000 unit capsule Take 1 capsule every week by oral route.  co-enzyme Q-10, ubiquinone, 30 mg capsule Take by mouth  estradioL (ESTRACE) 0.01 % (0.1 mg/gram) vaginal cream PLACE ONE APPLICATORFUL VAGINALLY AT BEDTIME  loratadine (CLARITIN) 10 mg tablet Take 1 tablet every day by oral route.   No current facility-administered medications on file prior to visit.   Family History  Problem Relation Age of Onset  Obesity Mother  High blood pressure (Hypertension) Mother  Hyperlipidemia (Elevated cholesterol) Mother  Diabetes Mother  High blood pressure (Hypertension) Father  Hyperlipidemia (Elevated cholesterol) Father  Coronary Artery Disease (Blocked arteries around heart) Father  Diabetes Father    Social History   Tobacco Use  Smoking Status Former  Types: Cigarettes  Quit date: 11/17/2021  Years since quitting: 0.4  Smokeless Tobacco Never    Social History   Socioeconomic History  Marital status: Married  Tobacco Use  Smoking status: Former  Types: Cigarettes  Quit date: 11/17/2021  Years since quitting: 0.4  Smokeless tobacco: Never  Substance and  Sexual Activity  Alcohol use: Not Currently  Drug use: Not Currently   Objective:   Vitals:  BP: (!) 142/74  Pulse: 110  Weight:  86.2 kg (190 lb)  Height: 157.5 cm ('5\' 2"'$ )   Body mass index is 34.75 kg/m.  Physical Exam   GENERAL APPEARANCE Comfortable, no acute issues Development: normal Gross deformities: none  SKIN Rash, lesions, ulcers: none Induration, erythema: none Nodules: none palpable  EYES Conjunctiva and lids: normal Pupils: equal and reactive  EARS, NOSE, MOUTH, THROAT External ears: no lesion or deformity External nose: no lesion or deformity Hearing: grossly normal  NECK Symmetric: yes Trachea: midline Thyroid: no palpable nodules in the thyroid bed  CHEST Respiratory effort: normal Retraction or accessory muscle use: no Breath sounds: normal bilaterally Rales, rhonchi, wheeze: none  CARDIOVASCULAR Auscultation: regular rhythm, normal rate Murmurs: none Pulses: radial pulse 2+ palpable Lower extremity edema: none  ABDOMEN Not assessed  GENITOURINARY/RECTAL Not assessed  MUSCULOSKELETAL Station and gait: normal Digits and nails: no clubbing or cyanosis Muscle strength: grossly normal all extremities Range of motion: grossly normal all extremities Deformity: none  LYMPHATIC Cervical: none palpable Supraclavicular: none palpable  PSYCHIATRIC Oriented to person, place, and time: yes Mood and affect: normal for situation Judgment and insight: appropriate for situation   Assessment and Plan:   Primary hyperparathyroidism (CMS-HCC)  Patient is referred by her endocrinologist, Dr. Renato Shin, for surgical evaluation and management of suspected primary hyperparathyroidism. Dr. Loanne Drilling has recently retired and her case will be taken over by Dr. Philemon Kingdom.  Patient provided with a copy of "Parathyroid Surgery: Treatment for Your Parathyroid Gland Problem", published by [employer], 12 pages. Book reviewed and explained to patient during visit today.  The patient does have biochemical evidence of primary hyperparathyroidism. She is symptomatic. I would like to  proceed with imaging studies to include an ultrasound examination of the neck as well as a nuclear medicine parathyroid scan. If the studies localize a parathyroid adenoma, then I believe she will be a good candidate for minimally invasive outpatient surgery. If the studies failed to localize the adenoma, then we will proceed with a 4D CT scan of the chest in hopes of identifying the adenoma. Today we did discuss minimally invasive parathyroid surgery and I provided her with written literature on parathyroid surgery to review at home. Discussed the size and location of the surgical incision. We discussed general anesthesia. We discussed her postoperative recovery. Patient understands and agrees to proceed with the above imaging studies. We will contact her with those results when they are available.  Armandina Gemma, MD [facility] A [place] practice Office: [phone]

## 2022-06-16 ENCOUNTER — Other Ambulatory Visit: Payer: Self-pay

## 2022-06-16 DIAGNOSIS — E782 Mixed hyperlipidemia: Secondary | ICD-10-CM

## 2022-06-16 MED ORDER — ATORVASTATIN CALCIUM 40 MG PO TABS: 40.0000 mg | ORAL_TABLET | Freq: Every day | ORAL | 0 refills | Status: DC

## 2022-06-18 NOTE — Anesthesia Preprocedure Evaluation (Addendum)
Anesthesia Evaluation  Patient identified by MRN, date of birth, ID band Patient awake    Reviewed: Allergy & Precautions, NPO status , Patient's Chart, lab work & pertinent test results  History of Anesthesia Complications (+) history of anesthetic complications  Airway Mallampati: III  TM Distance: >3 FB Neck ROM: Full  Mouth opening: Limited Mouth Opening  Dental no notable dental hx. 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)  negative genitourinary   Musculoskeletal negative musculoskeletal ROS (+)   Abdominal (+) + obese (31.82),   Peds negative pediatric ROS (+)  Hematology negative hematology ROS (+)   Anesthesia Other Findings primary hyperparathyroidism  Reproductive/Obstetrics negative OB ROS                            Anesthesia Physical  Anesthesia Plan  ASA: 3  Anesthesia Plan: General   Post-op Pain Management:    Induction: Intravenous  PONV Risk Score and Plan: 4 or greater and Treatment may vary due to age or medical condition, Midazolam, Ondansetron and Dexamethasone  Airway Management Planned: Oral ETT and LMA  Additional Equipment: None  Intra-op Plan:   Post-operative Plan: Extubation in OR  Informed Consent: I have reviewed the patients History and Physical, chart, labs and discussed the procedure including the risks, benefits and alternatives for the proposed anesthesia with the patient or  authorized representative who has indicated his/her understanding and acceptance.     Dental advisory given  Plan Discussed with: Anesthesiologist and CRNA  Anesthesia Plan Comments:        Anesthesia Quick Evaluation

## 2022-06-19 ENCOUNTER — Ambulatory Visit (HOSPITAL_BASED_OUTPATIENT_CLINIC_OR_DEPARTMENT_OTHER): Payer: Commercial Managed Care - HMO | Admitting: Anesthesiology

## 2022-06-19 ENCOUNTER — Encounter (HOSPITAL_COMMUNITY)
Admission: RE | Disposition: A | Discharge status: Home / Self Care | Payer: Self-pay | Source: Home / Self Care | Attending: Surgery

## 2022-06-19 ENCOUNTER — Encounter (HOSPITAL_COMMUNITY): Payer: Self-pay | Admitting: Surgery

## 2022-06-19 ENCOUNTER — Ambulatory Visit (HOSPITAL_COMMUNITY): Payer: Commercial Managed Care - HMO | Admitting: Anesthesiology

## 2022-06-19 ENCOUNTER — Other Ambulatory Visit: Payer: Self-pay

## 2022-06-19 ENCOUNTER — Ambulatory Visit (HOSPITAL_COMMUNITY)
Admission: RE | Admit: 2022-06-19 | Discharge: 2022-06-19 | Disposition: A | Discharge status: Home / Self Care | Payer: Commercial Managed Care - HMO | Attending: Surgery | Admitting: Surgery

## 2022-06-19 DIAGNOSIS — E210 Primary hyperparathyroidism: Secondary | ICD-10-CM

## 2022-06-19 DIAGNOSIS — Z6835 Body mass index (BMI) 35.0-35.9, adult: Secondary | ICD-10-CM | POA: Diagnosis not present

## 2022-06-19 DIAGNOSIS — Z8673 Personal history of transient ischemic attack (TIA), and cerebral infarction without residual deficits: Secondary | ICD-10-CM | POA: Diagnosis not present

## 2022-06-19 DIAGNOSIS — C50919 Malignant neoplasm of unspecified site of unspecified female breast: Secondary | ICD-10-CM | POA: Diagnosis not present

## 2022-06-19 DIAGNOSIS — Z87891 Personal history of nicotine dependence: Secondary | ICD-10-CM | POA: Insufficient documentation

## 2022-06-19 DIAGNOSIS — D351 Benign neoplasm of parathyroid gland: Secondary | ICD-10-CM | POA: Insufficient documentation

## 2022-06-19 DIAGNOSIS — E669 Obesity, unspecified: Secondary | ICD-10-CM | POA: Diagnosis not present

## 2022-06-19 DIAGNOSIS — Z79811 Long term (current) use of aromatase inhibitors: Secondary | ICD-10-CM | POA: Diagnosis not present

## 2022-06-19 DIAGNOSIS — M8580 Other specified disorders of bone density and structure, unspecified site: Secondary | ICD-10-CM | POA: Insufficient documentation

## 2022-06-19 HISTORY — PX: PARATHYROIDECTOMY: SHX19

## 2022-06-19 SURGERY — PARATHYROIDECTOMY
Anesthesia: General | Laterality: Right

## 2022-06-19 MED ORDER — ROCURONIUM BROMIDE 10 MG/ML (PF) SYRINGE
PREFILLED_SYRINGE | INTRAVENOUS | Status: AC
  Filled 2022-06-19: qty 10

## 2022-06-19 MED ORDER — CHLORHEXIDINE GLUCONATE CLOTH 2 % EX PADS: 6.0000 | MEDICATED_PAD | Freq: Once | CUTANEOUS | Status: DC

## 2022-06-19 MED ORDER — ACETAMINOPHEN 325 MG PO TABS: 325.0000 mg | ORAL_TABLET | ORAL | Status: DC | PRN

## 2022-06-19 MED ORDER — ONDANSETRON HCL 4 MG/2ML IJ SOLN: 4.0000 mg | Freq: Once | INTRAMUSCULAR | Status: DC | PRN

## 2022-06-19 MED ORDER — ROCURONIUM BROMIDE 10 MG/ML (PF) SYRINGE
PREFILLED_SYRINGE | INTRAVENOUS | Status: DC | PRN
  Administered 2022-06-19: 50 mg via INTRAVENOUS

## 2022-06-19 MED ORDER — BUPIVACAINE HCL (PF) 0.25 % IJ SOLN
INTRAMUSCULAR | Status: AC
  Filled 2022-06-19: qty 30

## 2022-06-19 MED ORDER — OXYCODONE HCL 5 MG PO TABS: 5.0000 mg | ORAL_TABLET | Freq: Once | ORAL | Status: DC | PRN

## 2022-06-19 MED ORDER — ONDANSETRON HCL 4 MG/2ML IJ SOLN
INTRAMUSCULAR | Status: AC
  Filled 2022-06-19: qty 2

## 2022-06-19 MED ORDER — SUGAMMADEX SODIUM 200 MG/2ML IV SOLN
INTRAVENOUS | Status: DC | PRN
  Administered 2022-06-19: 200 mg via INTRAVENOUS

## 2022-06-19 MED ORDER — FENTANYL CITRATE PF 50 MCG/ML IJ SOSY: 25.0000 ug | PREFILLED_SYRINGE | INTRAMUSCULAR | Status: DC | PRN

## 2022-06-19 MED ORDER — MEPERIDINE HCL 50 MG/ML IJ SOLN: 6.2500 mg | INTRAMUSCULAR | Status: DC | PRN

## 2022-06-19 MED ORDER — PHENYLEPHRINE 80 MCG/ML (10ML) SYRINGE FOR IV PUSH (FOR BLOOD PRESSURE SUPPORT)
PREFILLED_SYRINGE | INTRAVENOUS | Status: DC | PRN
  Administered 2022-06-19 (×2): 160 ug via INTRAVENOUS

## 2022-06-19 MED ORDER — HEMOSTATIC AGENTS (NO CHARGE) OPTIME
TOPICAL | Status: DC | PRN
  Administered 2022-06-19: 1

## 2022-06-19 MED ORDER — DEXAMETHASONE SODIUM PHOSPHATE 10 MG/ML IJ SOLN
INTRAMUSCULAR | Status: AC
  Filled 2022-06-19: qty 1

## 2022-06-19 MED ORDER — LIDOCAINE HCL (PF) 2 % IJ SOLN
INTRAMUSCULAR | Status: AC
  Filled 2022-06-19: qty 5

## 2022-06-19 MED ORDER — CHLORHEXIDINE GLUCONATE 0.12 % MT SOLN
15.0000 mL | Freq: Once | OROMUCOSAL | Status: AC
  Administered 2022-06-19: 15 mL via OROMUCOSAL

## 2022-06-19 MED ORDER — LACTATED RINGERS IV SOLN: INTRAVENOUS | Status: DC

## 2022-06-19 MED ORDER — ONDANSETRON HCL 4 MG/2ML IJ SOLN
INTRAMUSCULAR | Status: DC | PRN
  Administered 2022-06-19: 4 mg via INTRAVENOUS

## 2022-06-19 MED ORDER — ORAL CARE MOUTH RINSE: 15.0000 mL | Freq: Once | OROMUCOSAL | Status: AC

## 2022-06-19 MED ORDER — PROPOFOL 10 MG/ML IV BOLUS
INTRAVENOUS | Status: DC | PRN
  Administered 2022-06-19: 100 mg via INTRAVENOUS

## 2022-06-19 MED ORDER — MIDAZOLAM HCL 5 MG/5ML IJ SOLN
INTRAMUSCULAR | Status: DC | PRN
  Administered 2022-06-19: 2 mg via INTRAVENOUS

## 2022-06-19 MED ORDER — CIPROFLOXACIN IN D5W 400 MG/200ML IV SOLN
400.0000 mg | INTRAVENOUS | Status: AC
  Administered 2022-06-19: 400 mg via INTRAVENOUS
  Filled 2022-06-19: qty 200

## 2022-06-19 MED ORDER — BUPIVACAINE HCL 0.25 % IJ SOLN
INTRAMUSCULAR | Status: DC | PRN
  Administered 2022-06-19: 10 mL

## 2022-06-19 MED ORDER — LIDOCAINE HCL 2 % IJ SOLN
INTRAMUSCULAR | Status: AC
  Filled 2022-06-19: qty 20

## 2022-06-19 MED ORDER — LIDOCAINE 2% (20 MG/ML) 5 ML SYRINGE
INTRAMUSCULAR | Status: DC | PRN
  Administered 2022-06-19: 1.5 mg/kg/h via INTRAVENOUS
  Administered 2022-06-19: 100 mg via INTRAVENOUS

## 2022-06-19 MED ORDER — ACETAMINOPHEN 160 MG/5ML PO SOLN: 325.0000 mg | ORAL | Status: DC | PRN

## 2022-06-19 MED ORDER — FENTANYL CITRATE (PF) 250 MCG/5ML IJ SOLN
INTRAMUSCULAR | Status: AC
  Filled 2022-06-19: qty 5

## 2022-06-19 MED ORDER — TRAMADOL HCL 50 MG PO TABS: 50.0000 mg | ORAL_TABLET | Freq: Four times a day (QID) | ORAL | 0 refills | Status: DC | PRN

## 2022-06-19 MED ORDER — FENTANYL CITRATE (PF) 250 MCG/5ML IJ SOLN
INTRAMUSCULAR | Status: DC | PRN
  Administered 2022-06-19 (×5): 50 ug via INTRAVENOUS

## 2022-06-19 MED ORDER — MIDAZOLAM HCL 2 MG/2ML IJ SOLN
INTRAMUSCULAR | Status: AC
  Filled 2022-06-19: qty 2

## 2022-06-19 MED ORDER — DEXAMETHASONE SODIUM PHOSPHATE 10 MG/ML IJ SOLN
INTRAMUSCULAR | Status: DC | PRN
  Administered 2022-06-19: 10 mg via INTRAVENOUS

## 2022-06-19 MED ORDER — OXYCODONE HCL 5 MG/5ML PO SOLN: 5.0000 mg | Freq: Once | ORAL | Status: DC | PRN

## 2022-06-19 MED ORDER — PROPOFOL 10 MG/ML IV BOLUS
INTRAVENOUS | Status: AC
  Filled 2022-06-19: qty 20

## 2022-06-19 MED ORDER — 0.9 % SODIUM CHLORIDE (POUR BTL) OPTIME
TOPICAL | Status: DC | PRN
  Administered 2022-06-19: 1000 mL

## 2022-06-19 SURGICAL SUPPLY — 32 items
ATTRACTOMAT 16X20 MAGNETIC DRP (DRAPES) ×1 IMPLANT
BAG COUNTER SPONGE SURGICOUNT (BAG) ×1 IMPLANT
BLADE SURG 15 STRL LF DISP TIS (BLADE) ×1 IMPLANT
BLADE SURG 15 STRL SS (BLADE) ×1
CHLORAPREP W/TINT 26 (MISCELLANEOUS) ×1 IMPLANT
CLIP TI MEDIUM 6 (CLIP) ×2 IMPLANT
CLIP TI WIDE RED SMALL 6 (CLIP) ×2 IMPLANT
COVER SURGICAL LIGHT HANDLE (MISCELLANEOUS) ×1 IMPLANT
DERMABOND ADVANCED .7 DNX12 (GAUZE/BANDAGES/DRESSINGS) ×1 IMPLANT
DRAPE LAPAROTOMY T 98X78 PEDS (DRAPES) ×1 IMPLANT
DRAPE UTILITY XL STRL (DRAPES) ×1 IMPLANT
ELECT REM PT RETURN 15FT ADLT (MISCELLANEOUS) ×1 IMPLANT
GAUZE 4X4 16PLY ~~LOC~~+RFID DBL (SPONGE) ×1 IMPLANT
GLOVE SURG ORTHO 8.0 STRL STRW (GLOVE) ×1 IMPLANT
GOWN STRL REUS W/ TWL XL LVL3 (GOWN DISPOSABLE) ×3 IMPLANT
GOWN STRL REUS W/TWL XL LVL3 (GOWN DISPOSABLE) ×3
HEMOSTAT SURGICEL 2X4 FIBR (HEMOSTASIS) ×1 IMPLANT
ILLUMINATOR WAVEGUIDE N/F (MISCELLANEOUS) IMPLANT
KIT BASIN OR (CUSTOM PROCEDURE TRAY) ×1 IMPLANT
KIT TURNOVER KIT A (KITS) IMPLANT
NDL HYPO 25X1 1.5 SAFETY (NEEDLE) ×1 IMPLANT
NEEDLE HYPO 25X1 1.5 SAFETY (NEEDLE) ×1 IMPLANT
PACK BASIC VI WITH GOWN DISP (CUSTOM PROCEDURE TRAY) ×1 IMPLANT
PENCIL SMOKE EVACUATOR (MISCELLANEOUS) ×1 IMPLANT
SHEARS HARMONIC 9CM CVD (BLADE) IMPLANT
SUT MNCRL AB 4-0 PS2 18 (SUTURE) ×1 IMPLANT
SUT VIC AB 3-0 SH 18 (SUTURE) ×1 IMPLANT
SYR BULB IRRIG 60ML STRL (SYRINGE) ×1 IMPLANT
SYR CONTROL 10ML LL (SYRINGE) ×1 IMPLANT
TOWEL OR 17X26 10 PK STRL BLUE (TOWEL DISPOSABLE) ×1 IMPLANT
TOWEL OR NON WOVEN STRL DISP B (DISPOSABLE) ×1 IMPLANT
TUBING CONNECTING 10 (TUBING) ×1 IMPLANT

## 2022-06-19 NOTE — Discharge Instructions (Signed)
[facility] - Dr. Annalese Stiner  THYROID & PARATHYROID SURGERY:  POST-OP INSTRUCTIONS  Always review the instruction sheet provided by the hospital nurse at discharge.  A prescription for pain medication may be sent to your pharmacy at the time of discharge.  Take your pain medication as prescribed.  If narcotic pain medicine is not needed, then you may take acetaminophen (Tylenol) or ibuprofen (Advil) as needed for pain or soreness.  Take your normal home medications as prescribed unless otherwise directed.  If you need a refill on your pain medication, please contact the office during regular business hours.  Prescriptions will not be processed by the office after 5:00PM or on weekends.  Start with a light diet upon arrival home, such as soup and crackers or toast.  Be sure to drink plenty of fluids.  Resume your normal diet the day after surgery.  Most patients will experience some swelling and bruising on the chest and neck area.  Ice packs will help for the first 48 hours after arriving home.  Swelling and bruising will take several days to resolve.   It is common to experience some constipation after surgery.  Increasing fluid intake and taking a stool softener (Colace) will usually help to prevent this problem.  A mild laxative (Milk of Magnesia or Miralax) should be taken according to package directions if there has been no bowel movement after 48 hours.  Dermabond glue covers your incision. This seals the wound and you may shower at any time. The Dermabond will remain in place for about a week.  You may gradually remove the glue when it loosens around the edges.  If you need to loosen the Dermabond for removal, apply a layer of Vaseline to the wound for 15 minutes and then remove with a Kleenex. Your sutures are under the skin and will not show - they will dissolve on their own.  You may resume light daily activities beginning the day after discharge (such as self-care,  walking, climbing stairs), gradually increasing activities as tolerated. You may have sexual intercourse when it is comfortable. Refrain from any heavy lifting or straining until approved by your doctor. You may drive when you no longer are taking prescription pain medication, you can comfortably wear a seatbelt, and you can safely maneuver your car and apply the brakes.  You will see your doctor in the office for a follow-up appointment approximately three weeks after your surgery.  Make sure that you call for this appointment within a day or two after you arrive home to insure a convenient appointment time. Please have any requested laboratory tests performed a few days prior to your office visit so that the results will be available at your follow up appointment.  WHEN TO CALL THE [facility] OFFICE: -- Fever greater than 101.5 -- Inability to urinate -- Nausea and/or vomiting - persistent -- Extreme swelling or bruising -- Continued bleeding from incision -- Increased pain, redness, or drainage from the incision -- Difficulty swallowing or breathing -- Muscle cramping or spasms -- Numbness or tingling in hands or around lips  The clinic staff is available to answer your questions during regular business hours.  Please don't hesitate to call and ask to speak to one of the nurses if you have concerns.  [facility] OFFICE: [phone] (24 hours)  Please sign up for MyChart accounts. This will allow you to communicate directly with my nurse or myself without having to call the office. It will also allow you   to view your test results. You will need to enroll in MyChart for my office ([facility]) and for the hospital ([place]).  Ramiel Forti, MD [facility] A [facility] practice 

## 2022-06-19 NOTE — Transfer of Care (Signed)
Immediate Anesthesia Transfer of Care Note  Patient: Victoria Parsons  Procedure(s) Performed: RIGHT INFERIOR PARATHYROIDECTOMY (Right)  Patient Location: PACU  Anesthesia Type:General  Level of Consciousness: awake, alert  and oriented  Airway & Oxygen Therapy: Patient Spontanous Breathing and Patient connected to face mask oxygen  Post-op Assessment: Report given to RN and Post -op Vital signs reviewed and stable  Post vital signs: Reviewed and stable  Last Vitals:  Vitals Value Taken Time  BP    Temp    Pulse 93 06/19/22 0831  Resp 12 06/19/22 0831  SpO2 100 % 06/19/22 0831  Vitals shown include unvalidated device data.  Last Pain:  Vitals:   06/19/22 0556  TempSrc:   PainSc: 0-No pain         Complications: No notable events documented.

## 2022-06-19 NOTE — Anesthesia Postprocedure Evaluation (Signed)
Anesthesia Post Note  Patient: Victoria Parsons  Procedure(s) Performed: RIGHT INFERIOR PARATHYROIDECTOMY (Right)     Patient location during evaluation: PACU Anesthesia Type: General Level of consciousness: awake and alert Pain management: pain level controlled Vital Signs Assessment: post-procedure vital signs reviewed and stable Respiratory status: spontaneous breathing, nonlabored ventilation, respiratory function stable and patient connected to nasal cannula oxygen Cardiovascular status: blood pressure returned to baseline and stable Postop Assessment: no apparent nausea or vomiting Anesthetic complications: no   No notable events documented.  Last Vitals:  Vitals:   06/19/22 0900 06/19/22 0915  BP: (!) 161/87 (!) 167/86  Pulse: 74 77  Resp: 19 16  Temp:    SpO2: 95% 94%    Last Pain:  Vitals:   06/19/22 0915  TempSrc:   PainSc: 0-No pain                 Ellanie Oppedisano

## 2022-06-19 NOTE — Anesthesia Procedure Notes (Signed)
Procedure Name: Intubation Date/Time: 06/19/2022 7:31 AM  Performed by: Maxwell Caul, CRNAPre-anesthesia Checklist: Patient identified, Emergency Drugs available, Suction available and Patient being monitored Patient Re-evaluated:Patient Re-evaluated prior to induction Oxygen Delivery Method: Circle system utilized Preoxygenation: Pre-oxygenation with 100% oxygen Induction Type: IV induction Ventilation: Mask ventilation without difficulty Laryngoscope Size: Mac and 4 Grade View: Grade I Tube type: Oral Tube size: 7.0 mm Number of attempts: 1 Airway Equipment and Method: Stylet Placement Confirmation: ETT inserted through vocal cords under direct vision, positive ETCO2 and breath sounds checked- equal and bilateral Secured at: 21 cm Tube secured with: Tape Dental Injury: Teeth and Oropharynx as per pre-operative assessment

## 2022-06-19 NOTE — Interval H&P Note (Signed)
History and Physical Interval Note:  06/19/2022 6:55 AM  Victoria Parsons  has presented today for surgery, with the diagnosis of PRIMARY HYPERPARATHYROIDISM.  The various methods of treatment have been discussed with the patient and family. After consideration of risks, benefits and other options for treatment, the patient has consented to    Procedure(s): RIGHT INFERIOR PARATHYROIDECTOMY (Right) as a surgical intervention.    The patient's history has been reviewed, patient examined, no change in status, stable for surgery.  I have reviewed the patient's chart and labs.  Questions were answered to the patient's satisfaction.    Armandina Gemma, [facility] A [place] practice Office: [place]

## 2022-06-19 NOTE — Op Note (Signed)
OPERATIVE REPORT - PARATHYROIDECTOMY  Preoperative diagnosis: Primary hyperparathyroidism  Postop diagnosis: Same  Procedure: Right superior minimally invasive parathyroidectomy  Surgeon:  Armandina Gemma, MD  Anesthesia: General endotracheal  Estimated blood loss: Minimal  Preparation: ChloraPrep  Indications: Patient is referred by Dr. Renato Shin for surgical evaluation and recommendations regarding suspected primary hyperparathyroidism. Patient has had a history of hypercalcemia with slowly rising serum calcium levels. Her most recent laboratory studies show a calcium level of 11.4 and an intact PTH level of 110. 25-hydroxy vitamin D was normal at 55.2. 24-hour urine collection for calcium was normal at 155. Imaging studies included a sestamibi scan which localized a right inferior adenoma. Patient is symptomatic with mild to moderate fatigue, joint pain involving the hip, and a history of osteopenia. Patient does have a personal history of breast cancer and is currently still on anastrozole. Patient has had no prior head or neck surgery. There is no family history of parathyroid disease or other endocrine neoplasm. Patient presents today for parathyroidectomy.  Procedure: The patient was prepared in the pre-operative holding area. The patient was brought to the operating room and placed in a supine position on the operating room table. Following administration of general anesthesia, the patient was positioned and then prepped and draped in the usual strict aseptic fashion. After ascertaining that an adequate level of anesthesia been achieved, a neck incision was made with a #15 blade. Dissection was carried through subcutaneous tissues and platysma. Hemostasis was obtained with the electrocautery. Skin flaps were developed circumferentially and a Weitlander retractor was placed for exposure.  Strap muscles were incised in the midline. Strap muscles were reflected laterally exposing the right  thyroid lobe. With gentle blunt dissection the thyroid lobe was mobilized.  Dissection was carried posteriorly and an enlarged parathyroid gland was identified. It was gently mobilized. Vascular structures originated superiorly, consistent with a superior gland.  The vascular pedicle was dissected out and divided between medium ligaclips. Care was taken to avoid the recurrent laryngeal nerve. The parathyroid gland was completely excised. It was submitted to pathology where frozen section confirmed hypercellular parathyroid tissue consistent with adenoma.  Neck was irrigated with warm saline and good hemostasis was noted. Fibrillar was placed in the operative field. Strap muscles were approximated in the midline with interrupted 3-0 Vicryl sutures. Platysma was closed with interrupted 3-0 Vicryl sutures. Marcaine was infiltrated circumferentially. Skin was closed with a running 4-0 Monocryl subcuticular suture. Wound was washed and dried and Dermabond was applied. Patient was awakened from anesthesia and brought to the recovery room. The patient tolerated the procedure well.   Armandina Gemma, [facility] Office: [phone]

## 2022-06-19 NOTE — Addendum Note (Signed)
Addendum  created 06/19/22 1025 by Maxwell Caul, CRNA   Charge Capture section accepted

## 2022-06-20 ENCOUNTER — Encounter (HOSPITAL_COMMUNITY): Payer: Self-pay | Admitting: Surgery

## 2022-06-22 LAB — SURGICAL PATHOLOGY

## 2022-06-28 ENCOUNTER — Encounter: Payer: Self-pay | Admitting: Oncology

## 2022-07-01 ENCOUNTER — Ambulatory Visit (INDEPENDENT_AMBULATORY_CARE_PROVIDER_SITE_OTHER): Payer: Commercial Managed Care - HMO | Admitting: Physician Assistant

## 2022-07-01 ENCOUNTER — Encounter: Payer: Self-pay | Admitting: Physician Assistant

## 2022-07-01 VITALS — BP 110/80 | HR 86 | Temp 97.2°F | Ht 62.5 in | Wt 197.4 lb

## 2022-07-01 DIAGNOSIS — E559 Vitamin D deficiency, unspecified: Secondary | ICD-10-CM

## 2022-07-01 DIAGNOSIS — Z23 Encounter for immunization: Secondary | ICD-10-CM

## 2022-07-01 DIAGNOSIS — E782 Mixed hyperlipidemia: Secondary | ICD-10-CM

## 2022-07-01 DIAGNOSIS — C50912 Malignant neoplasm of unspecified site of left female breast: Secondary | ICD-10-CM

## 2022-07-01 NOTE — Progress Notes (Signed)
Subjective:  Patient ID: Victoria Parsons, female    DOB: 06-21-1961  Age: 61 y.o. MRN: [id number]  Chief Complaint  Patient presents with   Hyperlipidemia    HPI  Mixed hyperlipidemia  Pt presents with hyperlipidemia. Compliance with treatment has been good -The patient is compliant with medications, maintains a low cholesterol diet , follows up as directed , and maintains an exercise regimen . The patient denies experiencing any hypercholesterolemia related symptoms. Currently taking lipitor '40mg'$  qd  Pt with history of breast cancer - left breast- she follows with Dr Hinton Rao yearly and is on her last week or arimidex.  She is up to date on her mammogram  Pt with history of hyperparathyroidism.  She recently had one of parathyroid glands removed.  Is still following with Dr Harlow Asa.  At this point she is not currently on any type of calcium supplements but labwork due this week for him.  He has ordered a PTH and calcium (which will be included with the cmp we are ordering today)  Pt with history of tobacco abuse.  She stopped smoking about 6-7 months ago but still has desire to smoke.  Husband is also a smoker.  She has been on wellbutrin SR '150mg'$  but had decreased down to only once daily --- recommend to increase dose back up to bid at this time to see if helpful with cravings to smoke   Current Outpatient Medications on File Prior to Visit  Medication Sig Dispense Refill   anastrozole (ARIMIDEX) 1 MG tablet Take 1 mg by mouth daily.     aspirin EC 81 MG tablet Take 81 mg by mouth daily. Swallow whole.     atorvastatin (LIPITOR) 40 MG tablet Take 1 tablet (40 mg total) by mouth daily. 90 tablet 0   buPROPion (WELLBUTRIN SR) 150 MG 12 hr tablet TAKE 1 TABLET BY  MOUTH TWICE DAILY (Patient taking differently: 150 mg daily.) 180 tablet 1   Cholecalciferol 125 MCG (5000 UT) capsule Take 50,000 Units by mouth once a week.     Coenzyme Q10 (CO Q-10) 100 MG CAPS Take 100 mg by mouth daily.      estradiol (ESTRACE) 0.1 MG/GM vaginal cream Place 1 Applicatorful vaginally at bedtime. (Patient taking differently: Place 1 Applicatorful vaginally every other day.) 42.5 g 12   loratadine (CLARITIN) 10 MG tablet Take 10 mg by mouth at bedtime.      LUTEIN PO Take 20 mg by mouth daily.     Probiotic Product (PROBIOTIC PO) Take 1 capsule by mouth daily. Provitalize probiotic     sodium chloride (OCEAN) 0.65 % SOLN nasal spray Place 1 spray into both nostrils as needed for congestion.     No current facility-administered medications on file prior to visit.   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    Hypercalcemia    Hypercholesterolemia    Osteopenia 04/19/2019   Sixth (abducent) nerve palsy, right eye 2019   from TIA in 2019 is now resolved   Stroke ([place]) 2019   hx TIA   Tobacco abuse    quit 11/17/21 and takes wellbutrin while stopping smoking   Past Surgical History:  Procedure Laterality Date   BREAST LUMPECTOMY Left 2014   benign  BREAST SURGERY Left 2018   [place]   CERVICAL CONE BIOPSY N/A 11/2017   CERVICAL CONE BIOPSY N/A 11/2019   PARATHYROIDECTOMY Right 06/19/2022   Procedure: RIGHT INFERIOR PARATHYROIDECTOMY;  Surgeon: Armandina Gemma, MD;  Location: [facility];  Service: General;  Laterality: Right;   pilonidal cyst N/A    procedure in high school   ROBOTIC ASSISTED TOTAL HYSTERECTOMY WITH BILATERAL SALPINGO OOPHERECTOMY Bilateral 12/09/2021   Procedure: XI ROBOTIC ASSISTED TOTAL HYSTERECTOMY WITH BILATERAL SALPINGO OOPHORECTOMY;  Surgeon: Lafonda Mosses, MD;  Location: [facility];  Service: Gynecology;  Laterality: Bilateral;   TONSILLECTOMY Right    as a child   WISDOM TOOTH EXTRACTION      Family History  Problem Relation Age of Onset   Hypertension Mother     Hyperlipidemia Mother    Diabetes Mother    Arthritis Mother    Alzheimer's disease Mother    Osteoarthritis Mother    Hypertension Father    Hyperlipidemia Father    Diabetes Father    Coronary artery disease Father    Hypothyroidism Sister    Anxiety disorder Sister    Depression Sister    Breast cancer Paternal Aunt    Breast cancer Maternal Grandmother    Breast cancer Cousin    Hyperparathyroidism Neg Hx    Pancreatic cancer Neg Hx    Prostate cancer Neg Hx    Colon cancer Neg Hx    Ovarian cancer Neg Hx    Endometrial cancer Neg Hx    Social History   Socioeconomic History   Marital status: Married    Spouse name: Not on file   Number of children: 1   Years of education: Not on file   Highest education level: Not on file  Occupational History    Employer: [employer]  Tobacco Use   Smoking status: Former    Packs/day: 0.50    Years: 40.00    Total pack years: 20.00    Types: Cigarettes    Quit date: 11/17/2021    Years since quitting: 0.6   Smokeless tobacco: Never  Vaping Use   Vaping Use: Never used  Substance and Sexual Activity   Alcohol use: Not Currently   Drug use: Never   Sexual activity: Not Currently  Other Topics Concern   Not on file  Social History Narrative   Not on file   Social Determinants of Health   Financial Resource Strain: Not on file  Food Insecurity: Not on file  Transportation Needs: Not on file  Physical Activity: Not on file  Stress: Not on file  Social Connections: Not on file    Review of Systems CONSTITUTIONAL: Negative for chills, fatigue, fever, unintentional weight gain and unintentional weight loss.  E/N/T: Negative for ear pain, nasal congestion and sore throat.  CARDIOVASCULAR: Negative for chest pain, dizziness, palpitations and pedal edema.  RESPIRATORY: Negative for recent cough and dyspnea.  GASTROINTESTINAL: Negative for abdominal pain, acid reflux symptoms, constipation, diarrhea, nausea and vomiting.    INTEGUMENTARY: Negative for rash.   PSYCHIATRIC: Negative for sleep disturbance and to question depression screen.  Negative for depression, negative for anhedonia.       Objective:  PHYSICAL EXAM:   VS: BP 110/80 (BP Location: Left Arm, Patient Position: Sitting, Cuff Size: Large)   Pulse 86   Temp (!) 97.2 F (36.2 C) (Temporal)   Ht 5' 2.5" (1.588 m)   Wt 197 lb 6.4 oz (89.5 kg)   SpO2 99%   BMI 35.53  kg/m   GEN: Well nourished, well developed, in no acute distress  Neck: incision noted - healing well Cardiac: RRR; no murmurs, rubs, or gallops,no edema -  Respiratory:  normal respiratory rate and pattern with no distress - normal breath sounds with no rales, rhonchi, wheezes or rubs  Skin: warm and dry, no rash   Psych: euthymic mood, appropriate affect and demeanor  Lab Results  Component Value Date   WBC 8.5 06/12/2022   HGB 15.0 06/12/2022   HCT 45.8 06/12/2022   PLT 274 06/12/2022   GLUCOSE 97 06/12/2022   CHOL 220 (H) 12/25/2021   TRIG 186 (H) 12/25/2021   HDL 45 12/25/2021   LDLCALC 141 (H) 12/25/2021   ALT 36 (H) 12/25/2021   AST 23 12/25/2021   NA 138 06/12/2022   K 4.8 06/12/2022   CL 108 06/12/2022   CREATININE 0.94 06/12/2022   BUN 17 06/12/2022   CO2 25 06/12/2022   TSH 1.520 12/25/2021   INR 1.02 09/29/2018      Assessment & Plan:   Problem List Items Addressed This Visit       Other   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  No orders of the defined types were placed in this encounter.   Orders Placed This Encounter  Procedures   Flu Vaccine MDCK QUAD PF   CBC with Differential/Platelet   Comprehensive metabolic panel   TSH   Lipid panel   VITAMIN D 25 Hydroxy (Vit-D Deficiency, Fractures)     Follow-up: Return in about 6 months (around 12/30/2022) for chronic fasting follow up.  An After Visit Summary was printed and given to the patient.  Yetta Flock [facility] [phone]

## 2022-07-02 LAB — COMPREHENSIVE METABOLIC PANEL
ALT: 20 IU/L (ref 0–32)
AST: 19 IU/L (ref 0–40)
Albumin/Globulin Ratio: 1.6 (ref 1.2–2.2)
Albumin: 4.1 g/dL (ref 3.9–4.9)
Alkaline Phosphatase: 173 IU/L — ABNORMAL HIGH (ref 44–121)
BUN/Creatinine Ratio: 19 (ref 12–28)
BUN: 17 mg/dL (ref 8–27)
Bilirubin Total: 0.4 mg/dL (ref 0.0–1.2)
CO2: 21 mmol/L (ref 20–29)
Calcium: 9.3 mg/dL (ref 8.7–10.3)
Chloride: 105 mmol/L (ref 96–106)
Creatinine, Ser: 0.9 mg/dL (ref 0.57–1.00)
Globulin, Total: 2.6 g/dL (ref 1.5–4.5)
Glucose: 95 mg/dL (ref 70–99)
Potassium: 4.5 mmol/L (ref 3.5–5.2)
Sodium: 141 mmol/L (ref 134–144)
Total Protein: 6.7 g/dL (ref 6.0–8.5)
eGFR: 73 mL/min/{1.73_m2} (ref >59)

## 2022-07-02 LAB — CBC WITH DIFFERENTIAL/PLATELET
Basophils Absolute: 0.1 10*3/uL (ref 0.0–0.2)
Basos: 1 %
EOS (ABSOLUTE): 0.2 10*3/uL (ref 0.0–0.4)
Eos: 3 %
Hematocrit: 41.7 % (ref 34.0–46.6)
Hemoglobin: 14.1 g/dL (ref 11.1–15.9)
Immature Grans (Abs): 0 10*3/uL (ref 0.0–0.1)
Immature Granulocytes: 0 %
Lymphocytes Absolute: 1.9 10*3/uL (ref 0.7–3.1)
Lymphs: 23 %
MCH: 30.7 pg (ref 26.6–33.0)
MCHC: 33.8 g/dL (ref 31.5–35.7)
MCV: 91 fL (ref 79–97)
Monocytes Absolute: 0.7 10*3/uL (ref 0.1–0.9)
Monocytes: 8 %
Neutrophils Absolute: 5.5 10*3/uL (ref 1.4–7.0)
Neutrophils: 65 %
Platelets: 302 10*3/uL (ref 150–450)
RBC: 4.59 x10E6/uL (ref 3.77–5.28)
RDW: 12 % (ref 11.7–15.4)
WBC: 8.4 10*3/uL (ref 3.4–10.8)

## 2022-07-02 LAB — VITAMIN D 25 HYDROXY (VIT D DEFICIENCY, FRACTURES): Vit D, 25-Hydroxy: 47.1 ng/mL (ref 30.0–100.0)

## 2022-07-02 LAB — CARDIOVASCULAR RISK ASSESSMENT

## 2022-07-02 LAB — TSH: TSH: 1.15 u[IU]/mL (ref 0.450–4.500)

## 2022-07-02 LAB — LIPID PANEL
Chol/HDL Ratio: 4.7 ratio — ABNORMAL HIGH (ref 0.0–4.4)
Cholesterol, Total: 165 mg/dL (ref 100–199)
HDL: 35 mg/dL — ABNORMAL LOW (ref >39)
LDL Chol Calc (NIH): 96 mg/dL (ref 0–99)
Triglycerides: 197 mg/dL — ABNORMAL HIGH (ref 0–149)
VLDL Cholesterol Cal: 34 mg/dL (ref 5–40)

## 2022-07-29 ENCOUNTER — Other Ambulatory Visit: Payer: Self-pay

## 2022-07-29 DIAGNOSIS — Z716 Tobacco abuse counseling: Secondary | ICD-10-CM

## 2022-07-29 MED ORDER — BUPROPION HCL ER (SR) 150 MG PO TB12: 150.0000 mg | ORAL_TABLET | Freq: Two times a day (BID) | ORAL | 1 refills | Status: DC

## 2022-07-31 ENCOUNTER — Ambulatory Visit (INDEPENDENT_AMBULATORY_CARE_PROVIDER_SITE_OTHER): Payer: Commercial Managed Care - HMO | Admitting: Internal Medicine

## 2022-07-31 ENCOUNTER — Encounter: Payer: Self-pay | Admitting: Internal Medicine

## 2022-07-31 VITALS — BP 132/90 | HR 87 | Ht 62.5 in | Wt 200.6 lb

## 2022-07-31 DIAGNOSIS — E041 Nontoxic single thyroid nodule: Secondary | ICD-10-CM

## 2022-07-31 DIAGNOSIS — R748 Abnormal levels of other serum enzymes: Secondary | ICD-10-CM | POA: Diagnosis not present

## 2022-07-31 DIAGNOSIS — M8588 Other specified disorders of bone density and structure, other site: Secondary | ICD-10-CM | POA: Diagnosis not present

## 2022-07-31 DIAGNOSIS — E210 Primary hyperparathyroidism: Secondary | ICD-10-CM

## 2022-07-31 DIAGNOSIS — E559 Vitamin D deficiency, unspecified: Secondary | ICD-10-CM

## 2022-07-31 NOTE — Patient Instructions (Addendum)
Please continue the vitamin D at the same dose.  You should have an endocrinology follow-up appointment in 6 months.

## 2022-07-31 NOTE — Progress Notes (Signed)
Patient ID: Victoria Parsons, female   DOB: [birth year], 61 y.o.   MRN: [id number]   HPI  Victoria Parsons is a 61 y.o.-year-old female, returning for follow-up for hypercalcemia/hyperparathyroidism.  She previously saw Dr. Loanne Drilling, last visit 6 months ago.  Since last visit with Dr. Loanne Drilling, she had parathyroidectomy by Dr. Harlow Asa on 06/19/2022.  She is a did that she is not feeling better after the surgery. She still has mm/joint pains (R hip - will start PT), irritability, and fatigue (she admits that this improved a little).  However, she also mentions that she had a busy year - she quit smoking in 10/2021 and then had  TAH + BSO in 11/2021, and recently parathyroidectomy.  Pt was dx with hypercalcemia in 2021. I reviewed pt's pertinent labs: 07/01/2022: PTH 43 Lab Results  Component Value Date   PTH 110 (H) 01/26/2022   PTH 85 (H) 01/29/2021   PTH Comment 01/29/2021   PTH 41 04/11/2020   PTH Comment 04/11/2020   CALCIUM 9.3 07/01/2022   CALCIUM 10.7 (H) 06/12/2022   CALCIUM 11.4 (H) 01/26/2022   CALCIUM 11.3 (H) 12/25/2021   CALCIUM 10.4 (H) 11/28/2021   CALCIUM 11.1 (H) 06/26/2021   CALCIUM 11.3 (H) 05/01/2021   CALCIUM 10.7 (H) 01/29/2021   CALCIUM 10.9 (H) 12/24/2020   CALCIUM 10.6 (H) 06/18/2020   Component     Latest Ref Rng 05/30/2021  Calcium, Urine     Not Estab. mg/dL 15.5   Calcium, 24H Urine     0 - 320 mg/24 hr 155    Component     Latest Ref Rng 05/01/2021  Vitamin A (Retinoic Acid)     38 - 98 mcg/dL 60   PTH-Related Protein (PTH-RP)     11 - 20 pg/mL 8 (L)    Parathyroid technetium scan (05/21/2022): Consistent with right inferior adenoma  Thyroid ultrasound (05/21/2022): no lesion corresponding to the inferior parathyroid adenoma, 4 mm left thyroid nodule  Parathyroidectomy (06/19/2022) - Dr Harlow Asa: Hypercellular parathyroid tissue, consistent with parathyroid adenoma (2g, 2.5 x 1.5 x 0.8 cm)  DXA ([place] [facility]): Osteopenia    No h/o kidney  stones.  No h/o CKD. Last BUN/Cr: Lab Results  Component Value Date   BUN 17 07/01/2022   BUN 17 06/12/2022   CREATININE 0.90 07/01/2022   CREATININE 0.94 06/12/2022   She was on Anastrozole >> recently stopped (2 weeks ago).  No h/o vitamin D deficiency. Reviewed vit D levels: Lab Results  Component Value Date   VD25OH 47.1 07/01/2022   VD25OH 55.2 12/25/2021   VD25OH 27.7 (L) 06/26/2021   VD25OH 30.47 05/01/2021   Pt is  on high dose vitamin D 50,000 units weekly.  Pt. also has a history of elevated alkaline phosphatase: Lab Results  Component Value Date   ALKPHOS 173 (H) 07/01/2022   ALKPHOS 165 (H) 01/26/2022   ALKPHOS 171 (H) 12/25/2021   ALKPHOS 137 (H) 11/28/2021   ALKPHOS 171 (H) 06/26/2021   ALKPHOS 174 (H) 05/01/2021   ALKPHOS 152 (H) 12/24/2020   ALKPHOS 144 (H) 06/18/2020   ALKPHOS 139 (H) 05/13/2020   ALKPHOS 124 (H) 03/27/2020   ALKPHOS 122 (H) 12/15/2019   ALKPHOS 92 09/29/2018   Component Ref Range & Units 6 mo ago  ALKALINE PHOSPHATASE, BONE SPECIFIC 5.6 - 29.0 mcg/L 27.2   Comment: .  Reference Range, Premenopausal (mcg/L)   35-45 years            5.0-18.2  Component     Latest Ref Rng 01/26/2022  Alkaline Phosphatase     44 - 121 IU/L 165 (H)   LIVER FRACTION     18 - 85 % 61   BONE FRACTION     14 - 68 % 36   INTESTINAL FRAC.     0 - 18 % 3   ALKALINE PHOSPHATASE, BONE SPECIFIC     5.6 - 29.0 mcg/L 27.2     Component     Latest Ref Rng 05/01/2021  Alkaline Phosphatase     44 - 121 IU/L 174 (H)   LIVER FRACTION     18 - 85 % 49   BONE FRACTION     14 - 68 % 50   INTESTINAL FRAC.     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    Hypercalcemia    Hypercholesterolemia    Osteopenia 04/19/2019    Sixth (abducent) nerve palsy, right eye 2019   from TIA in 2019 is now resolved   Stroke ([place]) 2019   hx TIA   Tobacco abuse    quit 11/17/21 and takes wellbutrin while stopping smoking   Past Surgical History:  Procedure Laterality Date   BREAST LUMPECTOMY Left 2014   benign   BREAST SURGERY Left 2018   [place]   CERVICAL CONE BIOPSY N/A 11/2017   CERVICAL CONE BIOPSY N/A 11/2019   PARATHYROIDECTOMY Right 06/19/2022   Procedure: RIGHT INFERIOR PARATHYROIDECTOMY;  Surgeon: Armandina Gemma, MD;  Location: [facility];  Service: General;  Laterality: Right;   pilonidal cyst N/A    procedure in high school   ROBOTIC ASSISTED TOTAL HYSTERECTOMY WITH BILATERAL SALPINGO OOPHERECTOMY Bilateral 12/09/2021   Procedure: XI ROBOTIC ASSISTED TOTAL HYSTERECTOMY WITH BILATERAL SALPINGO OOPHORECTOMY;  Surgeon: Lafonda Mosses, MD;  Location: [facility];  Service: Gynecology;  Laterality: Bilateral;   TONSILLECTOMY Right    as a child   WISDOM TOOTH EXTRACTION     Social History   Socioeconomic History   Marital status: Married    Spouse name: Not on file   Number of children: 1   Years of education: Not on file   Highest education level: Not on file  Occupational History    Employer: [employer]  Tobacco Use   Smoking status: Former    Packs/day: 0.50    Years: 40.00    Total pack years: 20.00    Types: Cigarettes    Quit date: 11/17/2021    Years since quitting: 0.7   Smokeless tobacco: Never  Vaping Use   Vaping Use: Never used  Substance and Sexual Activity   Alcohol use: Not Currently   Drug use: Never   Sexual activity: Not Currently  Other Topics Concern   Not on file  Social History Narrative   Not on file   Social Determinants of Health   Financial Resource Strain: Not on file  Food Insecurity: Not on file  Transportation Needs: Not on file  Physical Activity: Not on file  Stress: Not on file  Social Connections: Not on file  Intimate Partner Violence: Not on file   Current  Outpatient Medications on File Prior to Visit  Medication Sig Dispense Refill   anastrozole (ARIMIDEX) 1 MG tablet Take 1 mg by mouth daily.     aspirin EC 81 MG tablet Take 81 mg by mouth daily. Swallow whole.     atorvastatin (LIPITOR) 40 MG tablet Take 1 tablet (40 mg total) by mouth daily. 90 tablet 0   buPROPion (WELLBUTRIN SR) 150 MG 12 hr tablet Take 1 tablet (150 mg total) by mouth 2 (two) times daily. 180 tablet 1   Cholecalciferol 125 MCG (5000 UT) capsule Take 50,000 Units by mouth once a week.     Coenzyme Q10 (CO Q-10) 100 MG CAPS Take 100 mg by mouth daily.     estradiol (ESTRACE) 0.1 MG/GM vaginal cream Place 1 Applicatorful vaginally at bedtime. (Patient taking differently: Place 1 Applicatorful vaginally every other day.) 42.5 g 12   loratadine (CLARITIN) 10 MG tablet Take 10 mg by mouth at bedtime.      LUTEIN PO Take 20 mg by mouth daily.     Probiotic Product (PROBIOTIC PO) Take 1 capsule by mouth daily. Provitalize probiotic     sodium chloride (OCEAN) 0.65 % SOLN nasal spray Place 1 spray into both nostrils as needed for congestion.     No current facility-administered medications on file prior to visit.   Allergies  Allergen Reactions   Codeine Nausea Only   Penicillins Other (See Comments)    DID THE REACTION INVOLVE: Swelling of the face/tongue/throat, SOB, or low BP? Yes Sudden or severe rash/hives, skin peeling, or the inside of the mouth or nose? Yes Did it require medical treatment? Yes When did it last happen? 15-20 YRS AGO    If all above answers are "NO", may proceed with cephalosporin use.    Family History  Problem Relation Age of Onset   Hypertension Mother    Hyperlipidemia Mother    Diabetes Mother    Arthritis Mother    Alzheimer's disease Mother    Osteoarthritis Mother    Hypertension Father    Hyperlipidemia Father    Diabetes Father    Coronary artery disease Father    Hypothyroidism Sister    Anxiety disorder Sister    Depression  Sister    Breast cancer Paternal Aunt    Breast cancer Maternal Grandmother    Breast cancer Cousin    Hyperparathyroidism Neg Hx    Pancreatic cancer Neg Hx    Prostate cancer Neg Hx    Colon cancer Neg Hx    Ovarian cancer Neg Hx    Endometrial cancer Neg Hx     PE: There were no vitals taken for this visit. Wt Readings from Last 3 Encounters:  07/01/22 197 lb 6.4 oz (89.5 kg)  06/19/22 192 lb 14.4 oz (87.5 kg)  06/12/22 193 lb (87.5 kg)   Constitutional: overweight, in NAD Eyes:  EOMI, no exophthalmos ENT: no neck masses, no cervical lymphadenopathy Cardiovascular: RRR, No MRG Respiratory: CTA B Musculoskeletal: no deformities Skin:no rashes Neurological: no tremor with outstretched hands  Assessment: 1. Primary hyperparathyroidism  2.  Vitamin D insufficiency  3.  Increased alkaline phosphatase  4.  Thyroid nodule  5.  Osteopenia  Plan: Patient with history of hypercalcemia, diagnosis primary hyperparathyroidism by Dr. Loanne Drilling.  Her calcium was chronically elevated, PTH related protein was low, vitamin A was normal, and urine calcium was also normal.  She was referred to surgery.  A nuclear medicine technetium sestamibi scan was positive for a right inferior parathyroid adenoma.  A thyroid ultrasound did  not show possible parathyroid adenoma.  She had minimally invasive parathyroidectomy with Dr. Harlow Asa last month.  PTH and calcium decreased to normal. -Patient is not feeling differently after the surgery compared to before.  We discussed that this was a large adenoma so it may take time for her to feel better.  The fact that her PTH and calcium levels normalized is very encouraging.  We discussed about possible complications from hyperparathyroidism: She does not have history of kidney stones, but does have osteopenia -We will repeat her labs in 6 months  2.  Vitamin D insufficiency -She has slightly low vitamin D level in the past -She continues on vitamin D 50,000  units weekly.  I reviewed her latest vitamin D level obtained last month and this was normal.  We will continue the same dose. -We will repeat this at next visit  3.  Increased alkaline phosphatase - will recheck this at next visit - If remains elevated, we may need a bone scan - we briefly discussed about Paget's disease at this visit, however, I believe that this may have been elevated in the setting of hyperparathyroidism  4.  Thyroid nodule -We reviewed together the results of her thyroid ultrasound.  She has a very small, 4 mm left thyroid nodule. -This is a very common finding and no investigation is needed for this -She has no neck compression symptoms or neck masses felt on palpation today  5.  Osteopenia -At today's visit we reviewed her previous bone density from last year.  This shows low T-scores, however, at that time, she had hyperparathyroidism and was also on anastrozole.  We discussed that this can increase bone loss.  She started anastrozole 2 weeks ago. -Stopping the aromatase inhibitor and having had parathyroidectomy should help her bone density.  Plan to obtain another bone density scan next year.  - Total time spent for the visit: 40 min, in precharting, reviewing Dr. Cordelia Pen last note, obtaining medical information from the chart and from the pt, reviewing her  previous labs, evaluations, and treatments, reviewing her symptoms, counseling her about her current problems (please see the discussed topics above), and developing a plan to further investigated; she had a number of questions which I addressed   Philemon Kingdom, MD PhD [facility]

## 2022-10-01 ENCOUNTER — Other Ambulatory Visit: Payer: Self-pay

## 2022-10-01 DIAGNOSIS — Z716 Tobacco abuse counseling: Secondary | ICD-10-CM

## 2022-10-01 DIAGNOSIS — E782 Mixed hyperlipidemia: Secondary | ICD-10-CM

## 2022-10-01 MED ORDER — ATORVASTATIN CALCIUM 40 MG PO TABS: 40.0000 mg | ORAL_TABLET | Freq: Every day | ORAL | 0 refills | Status: DC

## 2022-10-01 MED ORDER — BUPROPION HCL ER (SR) 150 MG PO TB12: 150.0000 mg | ORAL_TABLET | Freq: Two times a day (BID) | ORAL | 1 refills | Status: DC

## 2022-11-23 ENCOUNTER — Other Ambulatory Visit: Payer: Self-pay | Admitting: Oncology

## 2022-11-23 DIAGNOSIS — Z1231 Encounter for screening mammogram for malignant neoplasm of breast: Secondary | ICD-10-CM

## 2023-01-07 ENCOUNTER — Encounter: Payer: Self-pay | Admitting: Physician Assistant

## 2023-01-07 ENCOUNTER — Ambulatory Visit: Payer: Commercial Managed Care - HMO | Admitting: Physician Assistant

## 2023-01-07 ENCOUNTER — Other Ambulatory Visit: Payer: Self-pay

## 2023-01-07 VITALS — BP 126/80 | HR 85 | Temp 96.5°F | Ht 62.5 in | Wt 199.6 lb

## 2023-01-07 DIAGNOSIS — M79671 Pain in right foot: Secondary | ICD-10-CM

## 2023-01-07 DIAGNOSIS — E559 Vitamin D deficiency, unspecified: Secondary | ICD-10-CM | POA: Diagnosis not present

## 2023-01-07 DIAGNOSIS — E782 Mixed hyperlipidemia: Secondary | ICD-10-CM | POA: Diagnosis not present

## 2023-01-07 DIAGNOSIS — C50912 Malignant neoplasm of unspecified site of left female breast: Secondary | ICD-10-CM | POA: Diagnosis not present

## 2023-01-07 DIAGNOSIS — E210 Primary hyperparathyroidism: Secondary | ICD-10-CM | POA: Diagnosis not present

## 2023-01-07 DIAGNOSIS — Z122 Encounter for screening for malignant neoplasm of respiratory organs: Secondary | ICD-10-CM

## 2023-01-07 NOTE — Progress Notes (Signed)
Acute Office Visit  Subjective:    Patient ID: Victoria Parsons, female    DOB: 1961/09/25, 62 y.o.   MRN: [id number]  Chief Complaint  Patient presents with   Hyperlipidemia    Hyperlipidemia   Patient is in today for follow up hyperlipidemia Pt presents with hyperlipidemia.Compliance with treatment has been goodThe patient is compliant with medications, maintains a low cholesterol diet , follows up as directed , and maintains an exercise regimen . The patient denies experiencing any hypercholesterolemia related symptoms. Pt currently on lipitor 40mg  qd, fish oil and co Q10  Pt is currently taking wellbutrin 150mg  bid - voices no problems with medication - had been taking to help with smoking cessation and she stopped smoking 5 weeks ago (she actually takes this medication once daily most days)- she is ready to wean off medication and told to decrease to once daily for a week then qod for a week then stop  Pt with history of breast cancer - she currently follows with oncology every 6 months - Dr Hinton Rao - symptoms have been stable - she is scheduled for mammogram next week  Pt with history of hypercalcemia - has seen endocrinologist and has been advised not to take any calcium supplements and has follow up appt later this month  Pt has been seeing PT for right hip pain which has improved but is now noticing how much her right foot is actually bothering her now that her hip feels better.  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    Hypercalcemia    Hypercholesterolemia    Osteopenia  04/19/2019   Sixth (abducent) nerve palsy, right eye 2019   from TIA in 2019 is now resolved   Stroke 2019   hx TIA   Tobacco abuse    quit 11/17/21 and takes wellbutrin while stopping smoking    Past Surgical History:  Procedure Laterality Date   BREAST LUMPECTOMY Left 2014   benign   BREAST SURGERY Left 2018   [place]   CERVICAL CONE BIOPSY N/A 11/2017   CERVICAL CONE BIOPSY N/A 11/2019   PARATHYROIDECTOMY Right 06/19/2022   Procedure: RIGHT INFERIOR PARATHYROIDECTOMY;  Surgeon: Armandina Gemma, MD;  Location: [facility];  Service: General;  Laterality: Right;   pilonidal cyst N/A    procedure in high school   ROBOTIC ASSISTED TOTAL HYSTERECTOMY WITH BILATERAL SALPINGO OOPHERECTOMY Bilateral 12/09/2021   Procedure: XI ROBOTIC ASSISTED TOTAL HYSTERECTOMY WITH BILATERAL SALPINGO OOPHORECTOMY;  Surgeon: Lafonda Mosses, MD;  Location: [facility];  Service: Gynecology;  Laterality: Bilateral;   TONSILLECTOMY Right    as a child   WISDOM TOOTH EXTRACTION      Family History  Problem Relation Age of Onset   Hypertension Mother    Hyperlipidemia Mother    Diabetes Mother    Arthritis Mother    Alzheimer's disease Mother    Osteoarthritis Mother    Hypertension Father    Hyperlipidemia Father    Diabetes Father    Coronary artery disease Father  Hypothyroidism Sister    Anxiety disorder Sister    Depression Sister    Breast cancer Paternal Aunt    Breast cancer Maternal Grandmother    Breast cancer Cousin    Hyperparathyroidism Neg Hx    Pancreatic cancer Neg Hx    Prostate cancer Neg Hx    Colon cancer Neg Hx    Ovarian cancer Neg Hx    Endometrial cancer Neg Hx     Social History   Socioeconomic History   Marital status: Married    Spouse name: Not on file   Number of children: 1   Years of education: Not on file   Highest education level: Associate degree: academic program  Occupational History    Employer: [employer]  Tobacco Use   Smoking status: Former     Packs/day: 0.50    Years: 40.00    Additional pack years: 0.00    Total pack years: 20.00    Types: Cigarettes    Quit date: 11/17/2021    Years since quitting: 1.1   Smokeless tobacco: Never  Vaping Use   Vaping Use: Never used  Substance and Sexual Activity   Alcohol use: Not Currently   Drug use: Never   Sexual activity: Not Currently  Other Topics Concern   Not on file  Social History Narrative   Not on file   Social Determinants of Health   Financial Resource Strain: Low Risk  (01/06/2023)   Overall Financial Resource Strain (CARDIA)    Difficulty of Paying Living Expenses: Not hard at all  Food Insecurity: No Food Insecurity (01/06/2023)   Hunger Vital Sign    Worried About Running Out of Food in the Last Year: Never true    Ran Out of Food in the Last Year: Never true  Transportation Needs: No Transportation Needs (01/06/2023)   PRAPARE - Hydrologist (Medical): No    Lack of Transportation (Non-Medical): No  Physical Activity: Insufficiently Active (01/06/2023)   Exercise Vital Sign    Days of Exercise per Week: 2 days    Minutes of Exercise per Session: 30 min  Stress: Stress Concern Present (01/06/2023)   [place]    Feeling of Stress : To some extent  Social Connections: Unknown (01/06/2023)   Social Connection and Isolation Panel [NHANES]    Frequency of Communication with Friends and Family: Twice a week    Frequency of Social Gatherings with Friends and Family: Once a week    Attends Religious Services: Patient declined    Marine scientist or Organizations: Patient declined    Attends Music therapist: Not on file    Marital Status: Married  Human resources officer Violence: Not on file     Current Outpatient Medications:    aspirin EC 81 MG tablet, Take 81 mg by mouth daily. Swallow whole., Disp: , Rfl:    atorvastatin (LIPITOR) 40 MG tablet, Take 1 tablet  (40 mg total) by mouth daily., Disp: 90 tablet, Rfl: 0   buPROPion (WELLBUTRIN SR) 150 MG 12 hr tablet, Take 1 tablet (150 mg total) by mouth 2 (two) times daily., Disp: 180 tablet, Rfl: 1   Cholecalciferol 125 MCG (5000 UT) capsule, Take 50,000 Units by mouth once a week., Disp: , Rfl:    Coenzyme Q10 (CO Q-10) 100 MG CAPS, Take 100 mg by mouth daily., Disp: , Rfl:    estradiol (ESTRACE) 0.1 MG/GM vaginal cream, Place  1 Applicatorful vaginally at bedtime. (Patient taking differently: Place 1 Applicatorful vaginally every other day.), Disp: 42.5 g, Rfl: 12   loratadine (CLARITIN) 10 MG tablet, Take 10 mg by mouth at bedtime. , Disp: , Rfl:    Omega-3 Fatty Acids (FISH OIL) 1200 MG CAPS, , Disp: , Rfl:    Probiotic Product (PROBIOTIC PO), Take 1 capsule by mouth daily. Provitalize probiotic, Disp: , Rfl:    sodium chloride (OCEAN) 0.65 % SOLN nasal spray, Place 1 spray into both nostrils as needed for congestion., Disp: , Rfl:    Allergies  Allergen Reactions   Codeine Nausea Only   Penicillins Other (See Comments)    DID THE REACTION INVOLVE: Swelling of the face/tongue/throat, SOB, or low BP? Yes Sudden or severe rash/hives, skin peeling, or the inside of the mouth or nose? Yes Did it require medical treatment? Yes When did it last happen? 15-20 YRS AGO    If all above answers are "NO", may proceed with cephalosporin use.   CONSTITUTIONAL: Negative for chills, fatigue, fever, unintentional weight gain and unintentional weight loss.  E/N/T: Negative for ear pain, nasal congestion and sore throat.  CARDIOVASCULAR: Negative for chest pain, dizziness, palpitations and pedal edema.  RESPIRATORY: Negative for recent cough and dyspnea.  GASTROINTESTINAL: Negative for abdominal pain, acid reflux symptoms, constipation, diarrhea, nausea and vomiting.  MSK: see HPI INTEGUMENTARY: Negative for rash.  NEUROLOGICAL: Negative for dizziness and headaches.  PSYCHIATRIC: Negative for sleep disturbance  and to question depression screen.  Negative for depression, negative for anhedonia.      Objective:  PHYSICAL EXAM:   VS: BP 126/80 (BP Location: Left Arm, Patient Position: Sitting, Cuff Size: Large)   Pulse 85   Temp (!) 96.5 F (35.8 C) (Temporal)   Ht 5' 2.5" (1.588 m)   Wt 199 lb 9.6 oz (90.5 kg)   SpO2 98%   BMI 35.93 kg/m   GEN: Well nourished, well developed, in no acute distress  Cardiac: RRR; no murmurs, rubs, or gallops,no edema - Respiratory:  normal respiratory rate and pattern with no distress - normal breath sounds with no rales, rhonchi, wheezes or rubs MS: no deformity or atrophy  Skin: warm and dry, no rash  Psych: euthymic mood, appropriate affect and demeanor   Health Maintenance Due  Topic Date Due   Lung Cancer Screening  Never done   MAMMOGRAM  01/01/2023    There are no preventive care reminders to display for this patient.        Assessment & Plan:   Problem List Items Addressed This Visit       Other   Mixed hyperlipidemia - Primary   Relevant Orders   CBC with Differential/Platelet   Comprehensive metabolic panel   Lipid panel Continue meds as directed and watch diet       Infiltrating ductal carcinoma of left breast ([place]) follow up with oncology as directed      Hypercalcemia Continue follow up with endocrinologist  Right foot pain Xray ordered  Lung cancer screening Lung CT low dose ordered              No orders of the defined types were placed in this encounter.    SARA R Juliane Guest, PA-C

## 2023-01-08 LAB — CBC WITH DIFFERENTIAL/PLATELET
Basophils Absolute: 0.1 10*3/uL (ref 0.0–0.2)
Basos: 1 %
EOS (ABSOLUTE): 0.2 10*3/uL (ref 0.0–0.4)
Eos: 3 %
Hematocrit: 44.5 % (ref 34.0–46.6)
Hemoglobin: 14.7 g/dL (ref 11.1–15.9)
Immature Grans (Abs): 0 10*3/uL (ref 0.0–0.1)
Immature Granulocytes: 0 %
Lymphocytes Absolute: 1.5 10*3/uL (ref 0.7–3.1)
Lymphs: 21 %
MCH: 30.4 pg (ref 26.6–33.0)
MCHC: 33 g/dL (ref 31.5–35.7)
MCV: 92 fL (ref 79–97)
Monocytes Absolute: 0.6 10*3/uL (ref 0.1–0.9)
Monocytes: 9 %
Neutrophils Absolute: 4.5 10*3/uL (ref 1.4–7.0)
Neutrophils: 66 %
Platelets: 287 10*3/uL (ref 150–450)
RBC: 4.84 x10E6/uL (ref 3.77–5.28)
RDW: 13 % (ref 11.7–15.4)
WBC: 6.9 10*3/uL (ref 3.4–10.8)

## 2023-01-08 LAB — COMPREHENSIVE METABOLIC PANEL
ALT: 26 IU/L (ref 0–32)
AST: 22 IU/L (ref 0–40)
Albumin/Globulin Ratio: 1.7 (ref 1.2–2.2)
Albumin: 4.4 g/dL (ref 3.9–4.9)
Alkaline Phosphatase: 136 IU/L — ABNORMAL HIGH (ref 44–121)
BUN/Creatinine Ratio: 19 (ref 12–28)
BUN: 20 mg/dL (ref 8–27)
Bilirubin Total: 0.4 mg/dL (ref 0.0–1.2)
CO2: 22 mmol/L (ref 20–29)
Calcium: 9.9 mg/dL (ref 8.7–10.3)
Chloride: 105 mmol/L (ref 96–106)
Creatinine, Ser: 1.03 mg/dL — ABNORMAL HIGH (ref 0.57–1.00)
Globulin, Total: 2.6 g/dL (ref 1.5–4.5)
Glucose: 95 mg/dL (ref 70–99)
Potassium: 4.7 mmol/L (ref 3.5–5.2)
Sodium: 142 mmol/L (ref 134–144)
Total Protein: 7 g/dL (ref 6.0–8.5)
eGFR: 62 mL/min/{1.73_m2} (ref >59)

## 2023-01-08 LAB — LIPID PANEL
Chol/HDL Ratio: 4.3 ratio (ref 0.0–4.4)
Cholesterol, Total: 167 mg/dL (ref 100–199)
HDL: 39 mg/dL — ABNORMAL LOW (ref >39)
LDL Chol Calc (NIH): 98 mg/dL (ref 0–99)
Triglycerides: 174 mg/dL — ABNORMAL HIGH (ref 0–149)
VLDL Cholesterol Cal: 30 mg/dL (ref 5–40)

## 2023-01-08 LAB — CARDIOVASCULAR RISK ASSESSMENT

## 2023-01-08 LAB — VITAMIN D 25 HYDROXY (VIT D DEFICIENCY, FRACTURES): Vit D, 25-Hydroxy: 72.4 ng/mL (ref 30.0–100.0)

## 2023-01-11 ENCOUNTER — Ambulatory Visit: Payer: Managed Care, Other (non HMO) | Admitting: Oncology

## 2023-01-13 ENCOUNTER — Ambulatory Visit
Admission: RE | Admit: 2023-01-13 | Discharge: 2023-01-13 | Disposition: A | Discharge status: Home / Self Care | Payer: Commercial Managed Care - HMO | Source: Ambulatory Visit | Attending: Oncology | Admitting: Oncology

## 2023-01-13 DIAGNOSIS — Z1231 Encounter for screening mammogram for malignant neoplasm of breast: Secondary | ICD-10-CM

## 2023-01-13 HISTORY — DX: Malignant neoplasm of unspecified site of unspecified female breast: C50.919

## 2023-01-14 ENCOUNTER — Encounter: Payer: Self-pay | Admitting: Physician Assistant

## 2023-01-14 LAB — HM MAMMOGRAPHY

## 2023-01-15 NOTE — Progress Notes (Signed)
[facility]  [address] [place],  [place]  [address] [phone]  Clinic Day: 01/20/23  Referring physician: Marianne Sofia, PA-C  CHIEF COMPLAINT:  CC: Stage IIA left breast cancer  Current Treatment:  Hormonal therapy for a total of 5 years; currently on anastrozole 1 mg daily  HISTORY OF PRESENT ILLNESS:  Victoria Parsons is a 62 y.o. female with a history  of stage IIA (T2 N0 M0) hormone receptor positive left breast cancer diagnosed in March 2018.  She was treated with lumpectomy in April.  Pathology report revealed a 25 mm, grade 2, invasive ductal carcinoma with clear margins and 1 sentinel node negative for metastasis.  Estrogen and progesterone receptors were positive with HER 2 Neu negative.  Ki 67 was 15%.  EndoPredict testing  revealed a score of 3.3, which is in the low risk category, and corresponds with a 9.8% risk of recurrence in the next 10 years with hormonal therapy, so chemotherapy was not felt to be beneficial.  She received adjuvant radiation to the left breast completed in August.  Bone density scan as a baseline in June 2018 was normal.  She is postmenopausal having had her last menses at least 10 years ago.  She was placed on anastrozole 1 mg daily at the end of August 2018. Bilateral screening mammogram in March 2019 did not reveal any evidence of malignancy.  She had a TIA on Christmas Eve 2019, and had double vision for weeks afterwards.  She is now on aspirin 81 mg daily in addition to her usual medications.  She underwent a bone density scan back in July 2020 which revealed mild osteopenia of the hip and spine.  T-score of -1.4 of the right femur neck, previously -0.9.  Dual femur total mean is normal at -0.9, previously -0.2.  AP spine measures -1.1, previously -0.8, and is considered osteopenic. She will need to remain on calcium and vitamin D supplement.  INTERVAL HISTORY:  Victoria Parsons is here for routine follow up for stage IIA left  breast cancer. She had her hysterectomy which revealed chronic cervicitis but benign histology with no dysplasia. She continues Anastrozole 1 mg daily without difficulty and wants to continue it beyond 5 years. Patient states that she feels well and has no complaints of pain.  Her screening mammogram on 01/13/2023 done by [facility] mobile unit was clear. She won't be due for her bone density scan until mid July, 2024 so I will schedule that for her. I will see her back in 1 year with bilateral screening mammogram.  She denies signs of infection such as sore throat, sinus drainage, cough, or urinary symptoms.  She denies fevers or recurrent chills. She denies pain. She denies nausea, vomiting, chest pain, dyspnea or cough. Her appetite is good and her weight has decreased 1 pounds over last 2 weeks .  REVIEW OF SYSTEMS:  Review of Systems  Constitutional: Negative.  Negative for appetite change, chills, diaphoresis, fatigue, fever and unexpected weight change.  HENT:  Negative.  Negative for hearing loss, lump/mass, mouth sores, nosebleeds, sore throat, tinnitus, trouble swallowing and voice change.   Eyes: Negative.  Negative for eye problems and icterus.  Respiratory: Negative.  Negative for chest tightness, cough, hemoptysis, shortness of breath and wheezing.   Cardiovascular: Negative.  Negative for chest pain, leg swelling and palpitations.  Gastrointestinal: Negative.  Negative for abdominal distention, abdominal pain, blood in stool, constipation, diarrhea, nausea, rectal pain and vomiting.  Endocrine:  Negative.   Genitourinary: Negative.  Negative for bladder incontinence, difficulty urinating, dyspareunia, dysuria, frequency, hematuria, menstrual problem, nocturia, pelvic pain, vaginal bleeding and vaginal discharge.   Musculoskeletal: Negative.  Negative for arthralgias, back pain, flank pain, gait problem, myalgias, neck pain and neck stiffness.  Skin: Negative.  Negative for itching, rash  and wound.  Neurological: Negative.  Negative for dizziness, extremity weakness, gait problem, headaches, light-headedness, numbness, seizures and speech difficulty.  Hematological: Negative.  Negative for adenopathy. Does not bruise/bleed easily.  Psychiatric/Behavioral: Negative.  Negative for confusion, decreased concentration, depression, sleep disturbance and suicidal ideas. The patient is not nervous/anxious.      VITALS:  Blood pressure (!) 149/83, pulse 68, temperature 98 F (36.7 C), temperature source Oral, resp. rate 18, height 5' 2.5" (1.588 m), weight 198 lb 14.4 oz (90.2 kg), SpO2 98 %.  Wt Readings from Last 3 Encounters:  02/02/23 200 lb 9.6 oz (91 kg)  01/20/23 198 lb 14.4 oz (90.2 kg)  01/07/23 199 lb 9.6 oz (90.5 kg)    Body mass index is 35.8 kg/m.  Performance status (ECOG): 0 - Asymptomatic  PHYSICAL EXAM:  Physical Exam Vitals and nursing note reviewed.  Constitutional:      General: She is not in acute distress.    Appearance: Normal appearance. She is normal weight. She is not ill-appearing, toxic-appearing or diaphoretic.  HENT:     Head: Normocephalic and atraumatic.     Right Ear: Tympanic membrane, ear canal and external ear normal. There is no impacted cerumen.     Left Ear: Tympanic membrane, ear canal and external ear normal. There is no impacted cerumen.     Nose: Nose normal. No congestion or rhinorrhea.     Mouth/Throat:     Mouth: Mucous membranes are moist.     Pharynx: Oropharynx is clear. No oropharyngeal exudate or posterior oropharyngeal erythema.  Eyes:     General: No scleral icterus.       Right eye: No discharge.        Left eye: No discharge.     Extraocular Movements: Extraocular movements intact.     Conjunctiva/sclera: Conjunctivae normal.     Pupils: Pupils are equal, round, and reactive to light.  Neck:     Vascular: No carotid bruit.  Cardiovascular:     Rate and Rhythm: Normal rate and regular rhythm.     Pulses: Normal  pulses.     Heart sounds: Normal heart sounds. No murmur heard.    No friction rub. No gallop.  Pulmonary:     Effort: Pulmonary effort is normal. No respiratory distress.     Breath sounds: Normal breath sounds. No stridor. No wheezing, rhonchi or rales.  Chest:     Chest wall: No tenderness.  Breasts:    Right: Normal. No mass.     Left: Normal. No mass.     Comments: She has a well healed scar along the superior of the areolar complex but a nodular area bout 2cm above that.  Both breasts are without masses. Abdominal:     General: Bowel sounds are normal. There is no distension.     Palpations: Abdomen is soft. There is no hepatomegaly, splenomegaly or mass.     Tenderness: There is no abdominal tenderness. There is no right CVA tenderness, left CVA tenderness, guarding or rebound.     Hernia: No hernia is present.  Musculoskeletal:        General: No swelling, tenderness, deformity or signs of  injury. Normal range of motion.     Cervical back: Normal range of motion and neck supple. No rigidity or tenderness.     Right lower leg: No edema.     Left lower leg: No edema.  Lymphadenopathy:     Cervical: No cervical adenopathy.     Right cervical: No superficial, deep or posterior cervical adenopathy.    Left cervical: No superficial, deep or posterior cervical adenopathy.     Upper Body:     Right upper body: No supraclavicular, axillary or pectoral adenopathy.     Left upper body: No supraclavicular, axillary or pectoral adenopathy.  Skin:    General: Skin is warm and dry.     Coloration: Skin is not jaundiced or pale.     Findings: No bruising, erythema, lesion or rash.  Neurological:     General: No focal deficit present.     Mental Status: She is alert and oriented to person, place, and time. Mental status is at baseline.     Cranial Nerves: No cranial nerve deficit.     Sensory: No sensory deficit.     Motor: No weakness.     Coordination: Coordination normal.     Gait:  Gait normal.     Deep Tendon Reflexes: Reflexes normal.  Psychiatric:        Mood and Affect: Mood normal.        Behavior: Behavior normal.        Thought Content: Thought content normal.        Judgment: Judgment normal.     LABS:      Latest Ref Rng & Units 01/07/2023    8:41 AM 07/01/2022    8:59 AM 06/12/2022   10:42 AM  CBC  WBC 3.4 - 10.8 x10E3/uL 6.9  8.4  8.5   Hemoglobin 11.1 - 15.9 g/dL 82.9  56.2  13.0   Hematocrit 34.0 - 46.6 % 44.5  41.7  45.8   Platelets 150 - 450 x10E3/uL 287  302  274       Latest Ref Rng & Units 01/07/2023    8:41 AM 07/01/2022    8:59 AM 06/12/2022   10:42 AM  CMP  Glucose 70 - 99 mg/dL 95  95  97   BUN 8 - 27 mg/dL 20  17  17    Creatinine 0.57 - 1.00 mg/dL 8.65  7.84  6.96   Sodium 134 - 144 mmol/L 142  141  138   Potassium 3.5 - 5.2 mmol/L 4.7  4.5  4.8   Chloride 96 - 106 mmol/L 105  105  108   CO2 20 - 29 mmol/L 22  21  25    Calcium 8.7 - 10.3 mg/dL 9.9  9.3  29.5   Total Protein 6.0 - 8.5 g/dL 7.0  6.7    Total Bilirubin 0.0 - 1.2 mg/dL 0.4  0.4    Alkaline Phos 44 - 121 IU/L 136  173    AST 0 - 40 IU/L 22  19    ALT 0 - 32 IU/L 26  20     Component Ref Range & Units 8 d ago (01/07/23) 6 mo ago (07/01/22) 1 yr ago (12/25/21) 1 yr ago (06/26/21)  Vit D, 25-Hydroxy 30.0 - 100.0 ng/mL 72.4 47.1 CM 55.2 CM 27.7 Low  CM   Component Ref Range & Units 8 d ago (01/07/23) 6 mo ago (07/01/22) 1 yr ago (12/25/21)  Cholesterol, Total 100 - 199 mg/dL 284 132  220 High   Triglycerides 0 - 149 mg/dL 161 High  096 High  045 High   HDL >39 mg/dL 39 Low  35 Low  45  VLDL Cholesterol Cal 5 - 40 mg/dL 30 34 34  LDL Chol Calc (NIH) 0 - 99 mg/dL 98 96 409 High   Chol/HDL Ratio 0.0 - 4.4 ratio 4.3 4.7 High  CM 4.9 High  CM    STUDIES:  EXAM: 01/13/2023 DIGITAL SCREENING BILATERAL MAMMOGRAM WITH TOMOSYNTHESIS AND CAD IMPRESSION: No mammographic evidence of malignancy. A result letter of this screening mammogram will be mailed directly to the  patient. Allergies:  Allergies  Allergen Reactions   Codeine Nausea Only   Penicillins Other (See Comments)    DID THE REACTION INVOLVE: Swelling of the face/tongue/throat, SOB, or low BP? Yes  Sudden or severe rash/hives, skin peeling, or the inside of the mouth or nose? Yes  Did it require medical treatment? Yes  When did it last happen? 15-20 YRS AGO     If all above answers are "NO", may proceed with cephalosporin use.  DID THE REACTION INVOLVE: Swelling of the face/tongue/throat, SOB, or low BP? Yes, Sudden or severe rash/hives, skin peeling, or the inside of the mouth or nose? Yes, Did it require medical treatment? Yes, When did it last happen? 15-20 YRS AGO   , If all above answers are "NO", may proceed with cephalosporin use.    Current Medications: Current Outpatient Medications  Medication Sig Dispense Refill   aspirin EC 81 MG tablet Take 81 mg by mouth daily. Swallow whole.     atorvastatin (LIPITOR) 40 MG tablet Take 1 tablet (40 mg total) by mouth daily. 90 tablet 0   buPROPion (WELLBUTRIN SR) 150 MG 12 hr tablet Take 1 tablet (150 mg total) by mouth 2 (two) times daily. 180 tablet 1   Cholecalciferol 125 MCG (5000 UT) capsule Take 50,000 Units by mouth once a week.     Coenzyme Q10 (CO Q-10) 100 MG CAPS Take 100 mg by mouth daily.     estradiol (ESTRACE) 0.1 MG/GM vaginal cream Place 1 Applicatorful vaginally at bedtime. (Patient taking differently: Place 1 Applicatorful vaginally 2 (two) times a week.) 42.5 g 12   loratadine (CLARITIN) 10 MG tablet Take 10 mg by mouth at bedtime.      Omega-3 Fatty Acids (FISH OIL) 1200 MG CAPS      Probiotic Product (PROBIOTIC PO) Take 1 capsule by mouth daily. Provitalize probiotic     sodium chloride (OCEAN) 0.65 % SOLN nasal spray Place 1 spray into both nostrils as needed for congestion.     No current facility-administered medications for this visit.   ASSESSMENT & PLAN:  Assessment:   1.  History of stage IIA hormone  receptor positive breast cancer diagnosed in March 2018.  She remains without evidence of recurrence.  She was placed on anastrozole 1 mg daily in August 2018, and she completed 5 years in August, 2023 and wants to continue it beyond 5 years, so we will go for 7 to 10 years.     2.  Mild osteopenia.  Repeat bone density in July 2022 is relatively stable. We will repeat it this summer.   3.  Tobacco abuse.  She quit smoking and I praised her efforts and emphasized the importance of staying with this.    4.  History of a TIA in December 2019.  5.  Atrophic vaginitis.  I feel topical Premarin cream is safe for  her to use.  6.  Chronic hypercalcemia.  She continues to take vitamin D for her osteopenia.  Plan: She continues Anastrozole 1 mg daily without difficulty and wants to continue it beyond 5 years. I think that is reasonable in view of her stage II disease and endopredict score of 3.3.  Her screening mammogram on 01/13/2023 done by [facility] mobile unit was clear. She won't be due for her bone density scan until mid July, 2024 so I will schedule that for her. I will see her back in 1 year with bilateral screening mammogram.   The patient understands the plans discussed today and is in agreement with them.  She knows to contact our office if she develops concerns regarding her breast cancer or its treatment  I provided 15 minutes of face-to-face time during this this encounter and > 50% was spent counseling as documented under my assessment and plan.    Dellia Beckwith, MD [facility] [address] [place] [address] Dept: [phone] Dept Fax: [phone]    Rulon Sera Lassiter,acting as a scribe for Dellia Beckwith, MD.,have documented all relevant documentation on the behalf of Dellia Beckwith, MD,as directed by  Dellia Beckwith, MD while in the presence of Dellia Beckwith, MD.

## 2023-01-20 ENCOUNTER — Encounter: Payer: Self-pay | Admitting: Oncology

## 2023-01-20 ENCOUNTER — Other Ambulatory Visit: Payer: Self-pay | Admitting: Oncology

## 2023-01-20 ENCOUNTER — Inpatient Hospital Stay: Payer: Commercial Managed Care - HMO | Attending: Oncology | Admitting: Oncology

## 2023-01-20 VITALS — BP 149/83 | HR 68 | Temp 98.0°F | Resp 18 | Ht 62.5 in | Wt 198.9 lb

## 2023-01-20 DIAGNOSIS — C50912 Malignant neoplasm of unspecified site of left female breast: Secondary | ICD-10-CM

## 2023-01-20 DIAGNOSIS — M8580 Other specified disorders of bone density and structure, unspecified site: Secondary | ICD-10-CM | POA: Diagnosis not present

## 2023-02-02 ENCOUNTER — Other Ambulatory Visit: Payer: Self-pay | Admitting: Family Medicine

## 2023-02-02 ENCOUNTER — Encounter: Payer: Self-pay | Admitting: Internal Medicine

## 2023-02-02 ENCOUNTER — Ambulatory Visit: Payer: Commercial Managed Care - HMO | Admitting: Internal Medicine

## 2023-02-02 VITALS — BP 130/72 | HR 81 | Ht 62.5 in | Wt 200.6 lb

## 2023-02-02 DIAGNOSIS — R748 Abnormal levels of other serum enzymes: Secondary | ICD-10-CM

## 2023-02-02 DIAGNOSIS — M8588 Other specified disorders of bone density and structure, other site: Secondary | ICD-10-CM | POA: Diagnosis not present

## 2023-02-02 DIAGNOSIS — E041 Nontoxic single thyroid nodule: Secondary | ICD-10-CM | POA: Diagnosis not present

## 2023-02-02 DIAGNOSIS — E210 Primary hyperparathyroidism: Secondary | ICD-10-CM

## 2023-02-02 DIAGNOSIS — E782 Mixed hyperlipidemia: Secondary | ICD-10-CM

## 2023-02-02 DIAGNOSIS — E559 Vitamin D deficiency, unspecified: Secondary | ICD-10-CM

## 2023-02-02 NOTE — Progress Notes (Signed)
Patient ID: Victoria Parsons, female   DOB: 26-Dec-1960, 62 y.o.   MRN: [id number]  HPI  Victoria Parsons is a 62 y.o.-year-old female, returning for follow-up for hypercalcemia/hyperparathyroidism.  She previously saw Dr. Everardo All, last visit 6 months ago.  Interim history: She feels well, without complaints today.   She is tapering down Bupropion. She is in PT and dry needling for the R hip pain.  Reviewed and addended history: She had parathyroidectomy by Dr. Gerrit Friends on 06/19/2022.  The surgery, she still had mm/joint pains (R hip - in PT), irritability, and fatigue (she admitted that this improved a little).   She had a busy year and 2023: She quit smoking in 10/2021 and then had  TAH + BSO in 11/2021, and then parathyroidectomy.  Pt was dx with hypercalcemia in 2021. I reviewed pt's pertinent labs: 07/01/2022: PTH 43 Lab Results  Component Value Date   PTH 110 (H) 01/26/2022   PTH 85 (H) 01/29/2021   PTH Comment 01/29/2021   PTH 41 04/11/2020   PTH Comment 04/11/2020   CALCIUM 9.9 01/07/2023   CALCIUM 9.3 07/01/2022   CALCIUM 10.7 (H) 06/12/2022   CALCIUM 11.4 (H) 01/26/2022   CALCIUM 11.3 (H) 12/25/2021   CALCIUM 10.4 (H) 11/28/2021   CALCIUM 11.1 (H) 06/26/2021   CALCIUM 11.3 (H) 05/01/2021   CALCIUM 10.7 (H) 01/29/2021   CALCIUM 10.9 (H) 12/24/2020   Component     Latest Ref Rng 05/30/2021  Calcium, Urine     Not Estab. mg/dL 78.2   Calcium, 95A Urine     0 - 320 mg/24 hr 155    Component     Latest Ref Rng 05/01/2021  Vitamin A (Retinoic Acid)     38 - 98 mcg/dL 60   PTH-Related Protein (PTH-RP)     11 - 20 pg/mL 8 (L)    Parathyroid technetium scan (05/21/2022): Consistent with right inferior adenoma  Thyroid ultrasound (05/21/2022): no lesion corresponding to the inferior parathyroid adenoma, 4 mm left thyroid nodule  Parathyroidectomy (06/19/2022) - Dr Gerrit Friends: Hypercellular parathyroid tissue, consistent with parathyroid adenoma (2g, 2.5 x 1.5 x 0.8 cm)  DXA  ([facility] [facility]): Osteopenia    No h/o kidney stones.  No h/o CKD. Last BUN/Cr: Lab Results  Component Value Date   BUN 20 01/07/2023   BUN 17 07/01/2022   CREATININE 1.03 (H) 01/07/2023   CREATININE 0.90 07/01/2022   She was on Anastrozole >> stopped 06/2022.  No h/o vitamin D deficiency. Reviewed vit D levels: Lab Results  Component Value Date   VD25OH 72.4 01/07/2023   VD25OH 47.1 07/01/2022   VD25OH 55.2 12/25/2021   VD25OH 27.7 (L) 06/26/2021   VD25OH 30.47 05/01/2021   Pt is  on high dose vitamin D 50,000 units weekly.  Pt. also has a history of elevated alkaline phosphatase: Lab Results  Component Value Date   ALKPHOS 136 (H) 01/07/2023   ALKPHOS 173 (H) 07/01/2022   ALKPHOS 165 (H) 01/26/2022   ALKPHOS 171 (H) 12/25/2021   ALKPHOS 137 (H) 11/28/2021   ALKPHOS 171 (H) 06/26/2021   ALKPHOS 174 (H) 05/01/2021   ALKPHOS 152 (H) 12/24/2020   ALKPHOS 144 (H) 06/18/2020   ALKPHOS 139 (H) 05/13/2020   ALKPHOS 124 (H) 03/27/2020   ALKPHOS 122 (H) 12/15/2019   ALKPHOS 92 09/29/2018   Component Ref Range & Units 6 mo ago  ALKALINE PHOSPHATASE, BONE SPECIFIC 5.6 - 29.0 mcg/L 27.2   Comment: .  Reference Range, Premenopausal (mcg/L)  35-45 years            5.0-18.2    Component     Latest Ref Rng 01/26/2022  Alkaline Phosphatase     44 - 121 IU/L 165 (H)   LIVER FRACTION     18 - 85 % 61   BONE FRACTION     14 - 68 % 36   INTESTINAL FRAC.     0 - 18 % 3   ALKALINE PHOSPHATASE, BONE SPECIFIC     5.6 - 29.0 mcg/L 27.2     Component     Latest Ref Rng 05/01/2021  Alkaline Phosphatase     44 - 121 IU/L 174 (H)   LIVER FRACTION     18 - 85 % 49   BONE FRACTION     14 - 68 % 50   INTESTINAL FRAC.     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    Hypercalcemia    Hypercholesterolemia    Osteopenia 04/19/2019   Sixth (abducent) nerve palsy, right eye 2019   from TIA in 2019 is now resolved   Stroke (HCC) 2019   hx TIA   Tobacco abuse    quit 11/17/21 and takes wellbutrin while stopping smoking   Past Surgical History:  Procedure Laterality Date   BREAST LUMPECTOMY Left 2014   benign   BREAST LUMPECTOMY Left 2018   breast ca   BREAST SURGERY Left 2018   CA   CERVICAL CONE BIOPSY N/A 11/2017   CERVICAL CONE BIOPSY N/A 11/2019   PARATHYROIDECTOMY Right 06/19/2022   Procedure: RIGHT INFERIOR PARATHYROIDECTOMY;  Surgeon: Darnell Level, MD;  Location: [facility];  Service: General;  Laterality: Right;   pilonidal cyst N/A    procedure in high school   ROBOTIC ASSISTED TOTAL HYSTERECTOMY WITH BILATERAL SALPINGO OOPHERECTOMY Bilateral 12/09/2021   Procedure: XI ROBOTIC ASSISTED TOTAL HYSTERECTOMY WITH BILATERAL SALPINGO OOPHORECTOMY;  Surgeon: Carver Fila, MD;  Location: [facility];  Service: Gynecology;  Laterality: Bilateral;   TONSILLECTOMY Right    as a child   WISDOM TOOTH EXTRACTION     Social History   Socioeconomic History   Marital status: Married    Spouse name: Not on file   Number of children: 1   Years of education: Not on file   Highest education level: Associate degree: academic program  Occupational History    Employer: [employer]  Tobacco Use   Smoking status: Former    Packs/day: 0.50    Years: 40.00    Additional pack years: 0.00    Total pack years: 20.00    Types: Cigarettes    Quit date: 11/17/2021    Years since quitting: 1.2   Smokeless tobacco: Never  Vaping Use   Vaping Use: Never used  Substance and Sexual Activity   Alcohol use: Not Currently   Drug use: Never   Sexual activity: Not Currently  Other Topics Concern   Not on file  Social History Narrative   Not on file   Social Determinants of Health   Financial Resource Strain:  Low Risk  (01/06/2023)   Overall Financial Resource  Strain (CARDIA)    Difficulty of Paying Living Expenses: Not hard at all  Food Insecurity: No Food Insecurity (01/06/2023)   Hunger Vital Sign    Worried About Running Out of Food in the Last Year: Never true    Ran Out of Food in the Last Year: Never true  Transportation Needs: No Transportation Needs (01/06/2023)   PRAPARE - Administrator, Civil Service (Medical): No    Lack of Transportation (Non-Medical): No  Physical Activity: Insufficiently Active (01/06/2023)   Exercise Vital Sign    Days of Exercise per Week: 2 days    Minutes of Exercise per Session: 30 min  Stress: Stress Concern Present (01/06/2023)   [employer] of Occupational Health - Occupational Stress Questionnaire    Feeling of Stress : To some extent  Social Connections: Unknown (01/06/2023)   Social Connection and Isolation Panel [NHANES]    Frequency of Communication with Friends and Family: Twice a week    Frequency of Social Gatherings with Friends and Family: Once a week    Attends Religious Services: Patient declined    Database administrator or Organizations: Patient declined    Attends Engineer, structural: Not on file    Marital Status: Married  Catering manager Violence: Not on file   Current Outpatient Medications on File Prior to Visit  Medication Sig Dispense Refill   aspirin EC 81 MG tablet Take 81 mg by mouth daily. Swallow whole.     atorvastatin (LIPITOR) 40 MG tablet Take 1 tablet (40 mg total) by mouth daily. 90 tablet 0   buPROPion (WELLBUTRIN SR) 150 MG 12 hr tablet Take 1 tablet (150 mg total) by mouth 2 (two) times daily. 180 tablet 1   Cholecalciferol 125 MCG (5000 UT) capsule Take 50,000 Units by mouth once a week.     Coenzyme Q10 (CO Q-10) 100 MG CAPS Take 100 mg by mouth daily.     estradiol (ESTRACE) 0.1 MG/GM vaginal cream Place 1 Applicatorful vaginally at bedtime. (Patient taking differently: Place 1 Applicatorful  vaginally 2 (two) times a week.) 42.5 g 12   loratadine (CLARITIN) 10 MG tablet Take 10 mg by mouth at bedtime.      Omega-3 Fatty Acids (FISH OIL) 1200 MG CAPS      Probiotic Product (PROBIOTIC PO) Take 1 capsule by mouth daily. Provitalize probiotic     sodium chloride (OCEAN) 0.65 % SOLN nasal spray Place 1 spray into both nostrils as needed for congestion.     No current facility-administered medications on file prior to visit.   Allergies  Allergen Reactions   Codeine Nausea Only   Penicillins Other (See Comments)    DID THE REACTION INVOLVE: Swelling of the face/tongue/throat, SOB, or low BP? Yes  Sudden or severe rash/hives, skin peeling, or the inside of the mouth or nose? Yes  Did it require medical treatment? Yes  When did it last happen? 15-20 YRS AGO     If all above answers are "NO", may proceed with cephalosporin use.  DID THE REACTION INVOLVE: Swelling of the face/tongue/throat, SOB, or low BP? Yes, Sudden or severe rash/hives, skin peeling, or the inside of the mouth or nose? Yes, Did it require medical treatment? Yes, When did it last happen? 15-20 YRS AGO   , If all above answers are "NO", may proceed with cephalosporin use.   Family History  Problem Relation Age of Onset   Hypertension Mother    Hyperlipidemia Mother  Diabetes Mother    Arthritis Mother    Alzheimer's disease Mother    Osteoarthritis Mother    Hypertension Father    Hyperlipidemia Father    Diabetes Father    Coronary artery disease Father    Hypothyroidism Sister    Anxiety disorder Sister    Depression Sister    Breast cancer Paternal Aunt    Breast cancer Maternal Grandmother    Breast cancer Cousin    Hyperparathyroidism Neg Hx    Pancreatic cancer Neg Hx    Prostate cancer Neg Hx    Colon cancer Neg Hx    Ovarian cancer Neg Hx    Endometrial cancer Neg Hx    PE: BP 130/72 (BP Location: Right Arm, Patient Position: Sitting, Cuff Size: Normal)   Pulse 81   Ht 5' 2.5" (1.588  m)   Wt 200 lb 9.6 oz (91 kg)   SpO2 99%   BMI 36.11 kg/m  Wt Readings from Last 3 Encounters:  02/02/23 200 lb 9.6 oz (91 kg)  01/20/23 198 lb 14.4 oz (90.2 kg)  01/07/23 199 lb 9.6 oz (90.5 kg)   Constitutional: overweight, in NAD Eyes:  EOMI, no exophthalmos ENT: no neck masses, no cervical lymphadenopathy Cardiovascular: RRR, No MRG Respiratory: CTA B Musculoskeletal: no deformities Skin:no rashes Neurological: no tremor with outstretched hands  Assessment: 1. Primary hyperparathyroidism  2.  Vitamin D insufficiency  3.  Increased alkaline phosphatase  4.  Thyroid nodule  5.  Osteopenia  Plan: Patient with history of hypercalcemia, diagnosed as primary hyperparathyroidism by Dr. Everardo All.  Her calcium was chronically elevated, PTH related protein was low, vitamin A was normal, and urine calcium was also normal.  She was referred to surgery.  A nuclear medicine technetium sestamibi scan was positive for a right inferior parathyroid adenoma.  A thyroid ultrasound did not show possible parathyroid adenoma.  She had minimally invasive parathyroidectomy with Dr. Gerrit Friends last month.  PTH and calcium decreased to normal. -Patient did not necessarily feel different after the surgery.  We discussed that this was a large adenoma so it may take time for her to feel better.  The fact that her PTH and calcium levels normalized is very encouraging.  We discussed about possible complications from hyperparathyroidism: She does not have history of kidney stones, but does have osteopenia -She had a normal calcium level checked earlier this month, 9.9 -we will not repeat this today -Will repeat the calcium and PTH level at next visit, in 1 year  2.  Vitamin D insufficiency -She has slightly low vitamin D level in the past -Continues ergocalciferol 50,000 units weekly -Reviewed latest vitamin D level from 01/07/2020 this was at goal, at 72.4.  Will not repeat this today.  3.  Increased alkaline  phosphatase -She had a repeat alkaline phosphatase on 01/07/2023 and this was lower, decreased to 136 -Will continue to follow this - we may need a bone scan in the future if not trending down -It is possible that her high alkaline phosphatase was related to her hyperparathyroidism and not necessarily to other conditions like Paget's disease  4.  Thyroid nodule -I reviewed the results of her latest thyroid ultrasound 05/2022 - she has a very small, 4 mm left thyroid nodule. -We discussed that this is a very common finding and no investigation is needed for this -Neck compression symptoms or neck masses felt on palpation today -Latest TSH was reviewed and this was normal in 06/2022  5.  Osteopenia -  Reviewed her bone density scan from 2022.  This shows low T-scores, however, at that time, she had hyperparathyroidism and was also on anastrozole.  We discussed that aromatase inhibitors can decrease bone density.  She was able to stop anastrozole 2 weeks prior to our last appointment. -Stopping the aromatase inhibitor and having had parathyroidectomy should help her bone density.  Plan to obtain another bone density scan -she tells me this was already ordered by her oncologist and has not scheduled for 04/2023.  I advised her to have them send me the results.  Carlus Pavlov, MD PhD [facility]

## 2023-02-02 NOTE — Patient Instructions (Addendum)
Please continue the vitamin D at 50,000 units weekly.  Please schedule a new bone density scan.  You should have an endocrinology follow-up appointment in 1 year.

## 2023-02-08 ENCOUNTER — Ambulatory Visit
Admission: RE | Admit: 2023-02-08 | Discharge: 2023-02-08 | Disposition: A | Discharge status: Home / Self Care | Payer: Commercial Managed Care - HMO | Source: Ambulatory Visit | Attending: Physician Assistant | Admitting: Physician Assistant

## 2023-02-08 DIAGNOSIS — Z122 Encounter for screening for malignant neoplasm of respiratory organs: Secondary | ICD-10-CM

## 2023-03-25 ENCOUNTER — Other Ambulatory Visit: Payer: Self-pay

## 2023-03-25 DIAGNOSIS — E782 Mixed hyperlipidemia: Secondary | ICD-10-CM

## 2023-03-25 MED ORDER — ATORVASTATIN CALCIUM 40 MG PO TABS: 40.0000 mg | ORAL_TABLET | Freq: Every day | ORAL | 0 refills | Status: DC

## 2023-03-31 ENCOUNTER — Other Ambulatory Visit: Payer: Self-pay

## 2023-03-31 DIAGNOSIS — E782 Mixed hyperlipidemia: Secondary | ICD-10-CM

## 2023-03-31 MED ORDER — ATORVASTATIN CALCIUM 40 MG PO TABS: 40.0000 mg | ORAL_TABLET | Freq: Every day | ORAL | 0 refills | Status: DC

## 2023-04-14 LAB — HM DEXA SCAN

## 2023-04-26 ENCOUNTER — Encounter: Payer: Self-pay | Admitting: Oncology

## 2023-05-11 ENCOUNTER — Encounter: Payer: Self-pay | Admitting: Internal Medicine

## 2023-07-15 ENCOUNTER — Ambulatory Visit: Payer: Managed Care, Other (non HMO) | Admitting: Physician Assistant

## 2023-07-15 ENCOUNTER — Other Ambulatory Visit: Payer: Self-pay | Admitting: Physician Assistant

## 2023-07-15 ENCOUNTER — Encounter: Payer: Self-pay | Admitting: Physician Assistant

## 2023-07-15 VITALS — BP 116/80 | HR 78 | Temp 97.2°F | Ht 62.5 in | Wt 203.4 lb

## 2023-07-15 DIAGNOSIS — E559 Vitamin D deficiency, unspecified: Secondary | ICD-10-CM | POA: Diagnosis not present

## 2023-07-15 DIAGNOSIS — E782 Mixed hyperlipidemia: Secondary | ICD-10-CM | POA: Diagnosis not present

## 2023-07-15 DIAGNOSIS — K219 Gastro-esophageal reflux disease without esophagitis: Secondary | ICD-10-CM | POA: Insufficient documentation

## 2023-07-15 DIAGNOSIS — Z23 Encounter for immunization: Secondary | ICD-10-CM

## 2023-07-15 DIAGNOSIS — E210 Primary hyperparathyroidism: Secondary | ICD-10-CM | POA: Insufficient documentation

## 2023-07-15 DIAGNOSIS — L819 Disorder of pigmentation, unspecified: Secondary | ICD-10-CM

## 2023-07-15 DIAGNOSIS — C50912 Malignant neoplasm of unspecified site of left female breast: Secondary | ICD-10-CM | POA: Diagnosis not present

## 2023-07-15 DIAGNOSIS — M8580 Other specified disorders of bone density and structure, unspecified site: Secondary | ICD-10-CM

## 2023-07-15 DIAGNOSIS — I700 Atherosclerosis of aorta: Secondary | ICD-10-CM | POA: Insufficient documentation

## 2023-07-15 MED ORDER — PANTOPRAZOLE SODIUM 40 MG PO TBEC: 40.0000 mg | DELAYED_RELEASE_TABLET | Freq: Every day | ORAL | 1 refills | Status: DC

## 2023-07-15 NOTE — Progress Notes (Signed)
Acute Office Visit  Subjective:    Patient ID: Victoria Parsons, female    DOB: 21-Jun-1961, 62 y.o.   MRN: [id number]  Chief Complaint  Patient presents with   Medical Management of Chronic Issues    Hyperlipidemia   Patient is in today for follow up hyperlipidemia Pt presents with hyperlipidemia.Compliance with treatment has been goodThe patient is compliant with medications, maintains a low cholesterol diet , follows up as directed , and maintains an exercise regimen . The patient denies experiencing any hypercholesterolemia related symptoms. Pt currently on lipitor 40mg  qd, fish oil and co Q10 - she did have aortic atherosclerosis noted on CT  Pt with history of breast cancer - she currently follows with oncology every 6 months - Dr Gilman Buttner - symptoms have been stable -is up to date on mammogram  Pt with hypercalcemia - has seen endocrinologist and has been advised not to take any calcium supplements   Pt complains of GERD and at times feels burning in her throat  - would like to try medication to help symptoms  Pt would like flu shot today   Past Medical History:  Diagnosis Date   Breast cancer    on Anastrazole - 5 years will be fall 2023   Breast cancer (HCC)    Complication of anesthesia    one breast surgery, she woke up with itching all over   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    Hypercalcemia    Hypercholesterolemia    Osteopenia 04/19/2019   Sixth (abducent) nerve palsy, right eye 2019   from TIA in 2019 is now resolved   Stroke (HCC) 2019   hx TIA   Tobacco abuse    quit 11/17/21 and takes wellbutrin while stopping smoking    Past Surgical History:  Procedure Laterality Date   BREAST LUMPECTOMY Left 2014   benign   BREAST LUMPECTOMY Left 2018   breast ca   BREAST SURGERY Left 2018   CA   CERVICAL CONE BIOPSY N/A 11/2017   CERVICAL CONE BIOPSY N/A 11/2019   PARATHYROIDECTOMY Right 06/19/2022   Procedure: RIGHT INFERIOR  PARATHYROIDECTOMY;  Surgeon: Darnell Level, MD;  Location: [facility];  Service: General;  Laterality: Right;   pilonidal cyst N/A    procedure in high school   ROBOTIC ASSISTED TOTAL HYSTERECTOMY WITH BILATERAL SALPINGO OOPHERECTOMY Bilateral 12/09/2021   Procedure: XI ROBOTIC ASSISTED TOTAL HYSTERECTOMY WITH BILATERAL SALPINGO OOPHORECTOMY;  Surgeon: Carver Fila, MD;  Location: [facility];  Service: Gynecology;  Laterality: Bilateral;   TONSILLECTOMY Right    as a child   WISDOM TOOTH EXTRACTION      Family History  Problem Relation Age of Onset   Hypertension Mother    Hyperlipidemia Mother    Diabetes Mother    Arthritis Mother    Alzheimer's disease Mother    Osteoarthritis Mother    Hypertension Father    Hyperlipidemia Father    Diabetes Father    Coronary artery disease Father    Hypothyroidism Sister    Anxiety disorder Sister    Depression Sister    Breast cancer Paternal Aunt    Breast cancer Maternal Grandmother    Breast cancer Cousin    Hyperparathyroidism Neg Hx    Pancreatic cancer Neg Hx    Prostate cancer Neg Hx    Colon cancer Neg Hx    Ovarian cancer Neg Hx    Endometrial cancer Neg Hx     Social History  Socioeconomic History   Marital status: Married    Spouse name: Not on file   Number of children: 1   Years of education: Not on file   Highest education level: Associate degree: academic program  Occupational History    Employer: [employer]  Tobacco Use   Smoking status: Former    Current packs/day: 0.00    Average packs/day: 0.5 packs/day for 40.0 years (20.0 ttl pk-yrs)    Types: Cigarettes    Start date: 11/17/1981    Quit date: 11/17/2021    Years since quitting: 1.6   Smokeless tobacco: Never  Vaping Use   Vaping status: Never Used  Substance and Sexual Activity   Alcohol use: Not Currently   Drug use: Never   Sexual activity: Not Currently  Other Topics Concern   Not on file  Social History Narrative   Not on file   Social  Determinants of Health   Financial Resource Strain: Low Risk  (07/15/2023)   Overall Financial Resource Strain (CARDIA)    Difficulty of Paying Living Expenses: Not hard at all  Food Insecurity: No Food Insecurity (07/15/2023)   Hunger Vital Sign    Worried About Running Out of Food in the Last Year: Never true    Ran Out of Food in the Last Year: Never true  Transportation Needs: No Transportation Needs (07/15/2023)   PRAPARE - Administrator, Civil Service (Medical): No    Lack of Transportation (Non-Medical): No  Physical Activity: Insufficiently Active (07/15/2023)   Exercise Vital Sign    Days of Exercise per Week: 2 days    Minutes of Exercise per Session: 30 min  Stress: Stress Concern Present (07/15/2023)   [employer] of Occupational Health - Occupational Stress Questionnaire    Feeling of Stress : To some extent  Social Connections: Moderately Isolated (07/15/2023)   Social Connection and Isolation Panel [NHANES]    Frequency of Communication with Friends and Family: Twice a week    Frequency of Social Gatherings with Friends and Family: Once a week    Attends Religious Services: Never    Database administrator or Organizations: No    Attends Banker Meetings: Never    Marital Status: Married  Catering manager Violence: Not At Risk (07/15/2023)   Humiliation, Afraid, Rape, and Kick questionnaire    Fear of Current or Ex-Partner: No    Emotionally Abused: No    Physically Abused: No    Sexually Abused: No     Current Outpatient Medications:    aspirin EC 81 MG tablet, Take 81 mg by mouth daily. Swallow whole., Disp: , Rfl:    atorvastatin (LIPITOR) 40 MG tablet, Take 1 tablet (40 mg total) by mouth daily., Disp: 90 tablet, Rfl: 0   Cholecalciferol 125 MCG (5000 UT) capsule, Take 50,000 Units by mouth once a week., Disp: , Rfl:    Coenzyme Q10 (CO Q-10) 100 MG CAPS, Take 100 mg by mouth daily., Disp: , Rfl:    estradiol (ESTRACE) 0.1  MG/GM vaginal cream, Place 1 Applicatorful vaginally at bedtime. (Patient taking differently: Place 1 Applicatorful vaginally 2 (two) times a week.), Disp: 42.5 g, Rfl: 12   loratadine (CLARITIN) 10 MG tablet, Take 10 mg by mouth at bedtime. , Disp: , Rfl:    Omega-3 Fatty Acids (FISH OIL) 1200 MG CAPS, , Disp: , Rfl:    pantoprazole (PROTONIX) 40 MG tablet, Take 1 tablet (40 mg total) by mouth daily., Disp: 90 tablet,  Rfl: 1   Probiotic Product (PROBIOTIC PO), Take 1 capsule by mouth daily. Provitalize probiotic, Disp: , Rfl:    sodium chloride (OCEAN) 0.65 % SOLN nasal spray, Place 1 spray into both nostrils as needed for congestion., Disp: , Rfl:    Allergies  Allergen Reactions   Codeine Nausea Only   Penicillins Other (See Comments)    DID THE REACTION INVOLVE: Swelling of the face/tongue/throat, SOB, or low BP? Yes  Sudden or severe rash/hives, skin peeling, or the inside of the mouth or nose? Yes  Did it require medical treatment? Yes  When did it last happen? 15-20 YRS AGO     If all above answers are "NO", may proceed with cephalosporin use.  DID THE REACTION INVOLVE: Swelling of the face/tongue/throat, SOB, or low BP? Yes, Sudden or severe rash/hives, skin peeling, or the inside of the mouth or nose? Yes, Did it require medical treatment? Yes, When did it last happen? 15-20 YRS AGO   , If all above answers are "NO", may proceed with cephalosporin use.  CONSTITUTIONAL: Negative for chills, fatigue, fever, unintentional weight gain and unintentional weight loss.  E/N/T: Negative for ear pain, nasal congestion and sore throat.  CARDIOVASCULAR: Negative for chest pain, dizziness, palpitations and pedal edema.  RESPIRATORY: Negative for recent cough and dyspnea.  GASTROINTESTINAL:see HPI MSK: Negative for arthralgias and myalgias.  INTEGUMENTARY: Negative for rash.  NEUROLOGICAL: Negative for dizziness and headaches.  PSYCHIATRIC: Negative for sleep disturbance and to question  depression screen.  Negative for depression, negative for anhedonia.       Objective:  PHYSICAL EXAM:   VS: BP 116/80 (BP Location: Left Arm, Patient Position: Sitting, Cuff Size: Large)   Pulse 78   Temp (!) 97.2 F (36.2 C) (Temporal)   Ht 5' 2.5" (1.588 m)   Wt 203 lb 6.4 oz (92.3 kg)   SpO2 97%   BMI 36.61 kg/m   GEN: Well nourished, well developed, in no acute distress  Cardiac: RRR; no murmurs, rubs, or gallops,no edema - Respiratory:  normal respiratory rate and pattern with no distress - normal breath sounds with no rales, rhonchi, wheezes or rubs GI: normal bowel sounds, no masses or tenderness MS: no deformity or atrophy  Skin: warm and dry, no rash  Psych: euthymic mood, appropriate affect and demeanor    Health Maintenance Due  Topic Date Due   INFLUENZA VACCINE  05/06/2023    There are no preventive care reminders to display for this patient.        Assessment & Plan:   Problem List Items Addressed This Visit       Other   Mixed hyperlipidemia - Primary   Relevant Orders   CBC with Differential/Platelet   Comprehensive metabolic panel   Lipid panel Continue meds as directed and watch diet       Infiltrating ductal carcinoma of left breast (HCC) follow up with oncology as directed      Hypercalcemia Continue follow up with endocrinologist  GERD  Rx for protonix      Pt would like dermatology referral - will call back with provider she would like to see        Meds ordered this encounter  Medications   pantoprazole (PROTONIX) 40 MG tablet    Sig: Take 1 tablet (40 mg total) by mouth daily.    Dispense:  90 tablet    Refill:  1    Order Specific Question:   Supervising Provider  Answer:   COX, KIRSTEN [[id number]]     SARA R Daishaun Ayre, PA-C

## 2023-07-16 LAB — COMPREHENSIVE METABOLIC PANEL
ALT: 22 [IU]/L (ref 0–32)
AST: 20 [IU]/L (ref 0–40)
Albumin: 4.4 g/dL (ref 3.9–4.9)
Alkaline Phosphatase: 115 [IU]/L (ref 44–121)
BUN/Creatinine Ratio: 21 (ref 12–28)
BUN: 19 mg/dL (ref 8–27)
Bilirubin Total: 0.5 mg/dL (ref 0.0–1.2)
CO2: 22 mmol/L (ref 20–29)
Calcium: 9.4 mg/dL (ref 8.7–10.3)
Chloride: 105 mmol/L (ref 96–106)
Creatinine, Ser: 0.9 mg/dL (ref 0.57–1.00)
Globulin, Total: 2.3 g/dL (ref 1.5–4.5)
Glucose: 86 mg/dL (ref 70–99)
Potassium: 4.8 mmol/L (ref 3.5–5.2)
Sodium: 141 mmol/L (ref 134–144)
Total Protein: 6.7 g/dL (ref 6.0–8.5)
eGFR: 72 mL/min/{1.73_m2} (ref >59)

## 2023-07-16 LAB — CBC WITH DIFFERENTIAL/PLATELET
Basophils Absolute: 0.1 10*3/uL (ref 0.0–0.2)
Basos: 1 %
EOS (ABSOLUTE): 0.1 10*3/uL (ref 0.0–0.4)
Eos: 2 %
Hematocrit: 47.3 % — ABNORMAL HIGH (ref 34.0–46.6)
Hemoglobin: 15.4 g/dL (ref 11.1–15.9)
Immature Grans (Abs): 0 10*3/uL (ref 0.0–0.1)
Immature Granulocytes: 0 %
Lymphocytes Absolute: 2 10*3/uL (ref 0.7–3.1)
Lymphs: 28 %
MCH: 31.2 pg (ref 26.6–33.0)
MCHC: 32.6 g/dL (ref 31.5–35.7)
MCV: 96 fL (ref 79–97)
Monocytes Absolute: 0.7 10*3/uL (ref 0.1–0.9)
Monocytes: 9 %
Neutrophils Absolute: 4.5 10*3/uL (ref 1.4–7.0)
Neutrophils: 60 %
Platelets: 294 10*3/uL (ref 150–450)
RBC: 4.93 x10E6/uL (ref 3.77–5.28)
RDW: 12.2 % (ref 11.7–15.4)
WBC: 7.3 10*3/uL (ref 3.4–10.8)

## 2023-07-16 LAB — LIPID PANEL
Chol/HDL Ratio: 4.2 {ratio} (ref 0.0–4.4)
Cholesterol, Total: 157 mg/dL (ref 100–199)
HDL: 37 mg/dL — ABNORMAL LOW (ref >39)
LDL Chol Calc (NIH): 90 mg/dL (ref 0–99)
Triglycerides: 170 mg/dL — ABNORMAL HIGH (ref 0–149)
VLDL Cholesterol Cal: 30 mg/dL (ref 5–40)

## 2023-07-16 LAB — TSH: TSH: 1.81 u[IU]/mL (ref 0.450–4.500)

## 2023-07-16 LAB — VITAMIN D 25 HYDROXY (VIT D DEFICIENCY, FRACTURES): Vit D, 25-Hydroxy: 90.7 ng/mL (ref 30.0–100.0)

## 2023-07-18 ENCOUNTER — Other Ambulatory Visit: Payer: Self-pay | Admitting: Physician Assistant

## 2023-07-18 DIAGNOSIS — E782 Mixed hyperlipidemia: Secondary | ICD-10-CM

## 2023-07-19 MED ORDER — ATORVASTATIN CALCIUM 40 MG PO TABS: 40.0000 mg | ORAL_TABLET | Freq: Every day | ORAL | 0 refills | Status: DC

## 2023-09-22 ENCOUNTER — Other Ambulatory Visit: Payer: Self-pay | Admitting: Physician Assistant

## 2023-09-22 DIAGNOSIS — E782 Mixed hyperlipidemia: Secondary | ICD-10-CM

## 2023-09-22 MED ORDER — ATORVASTATIN CALCIUM 40 MG PO TABS: 40.0000 mg | ORAL_TABLET | Freq: Every day | ORAL | 0 refills | Status: DC

## 2023-12-20 ENCOUNTER — Other Ambulatory Visit: Payer: Self-pay | Admitting: Oncology

## 2023-12-20 DIAGNOSIS — Z1231 Encounter for screening mammogram for malignant neoplasm of breast: Secondary | ICD-10-CM

## 2023-12-20 NOTE — Addendum Note (Signed)
 Addended by: Jeannette Corpus on: 12/20/2023 01:19 PM   Modules accepted: Orders

## 2024-01-10 ENCOUNTER — Other Ambulatory Visit: Payer: Self-pay | Admitting: Physician Assistant

## 2024-01-10 DIAGNOSIS — E782 Mixed hyperlipidemia: Secondary | ICD-10-CM

## 2024-01-10 DIAGNOSIS — K219 Gastro-esophageal reflux disease without esophagitis: Secondary | ICD-10-CM

## 2024-01-14 ENCOUNTER — Ambulatory Visit (HOSPITAL_BASED_OUTPATIENT_CLINIC_OR_DEPARTMENT_OTHER)
Admission: RE | Admit: 2024-01-14 | Discharge: 2024-01-14 | Disposition: A | Discharge status: Home / Self Care | Source: Ambulatory Visit | Attending: Oncology | Admitting: Oncology

## 2024-01-14 ENCOUNTER — Encounter (HOSPITAL_BASED_OUTPATIENT_CLINIC_OR_DEPARTMENT_OTHER): Payer: Self-pay | Admitting: Radiology

## 2024-01-14 DIAGNOSIS — C50912 Malignant neoplasm of unspecified site of left female breast: Secondary | ICD-10-CM

## 2024-01-14 DIAGNOSIS — Z1231 Encounter for screening mammogram for malignant neoplasm of breast: Secondary | ICD-10-CM | POA: Diagnosis not present

## 2024-01-19 NOTE — Progress Notes (Signed)
 [facility]  [address] [place],  [place]  [address] [phone]  Clinic Day: 01/20/24  Referring physician: Cyndi Drain, PA-C  CHIEF COMPLAINT:  CC: Stage IIA left breast cancer  Current Treatment:  Hormonal therapy for a total of 5 years  HISTORY OF PRESENT ILLNESS:  Victoria Parsons is a 63 y.o. female with a history  of stage IIA (T2 N0 M0) hormone receptor positive left breast cancer diagnosed in March 2018.  She was treated with lumpectomy in April.  Pathology report revealed a 25 mm, grade 2, invasive ductal carcinoma with clear margins and 1 sentinel node negative for metastasis.  Estrogen and progesterone receptors were positive with HER 2 Neu negative.  Ki 67 was 15%.  EndoPredict testing  revealed a score of 3.3, which is in the low risk category, and corresponds with a 9.8% risk of recurrence in the next 10 years with hormonal therapy, so chemotherapy was not felt to be beneficial.  She received adjuvant radiation to the left breast completed in August.  Bone density scan as a baseline in June 2018 was normal.  She is postmenopausal having had her last menses at least 10 years ago.  She was placed on anastrozole  1 mg daily at the end of August 2018. Bilateral screening mammogram in March 2019 did not reveal any evidence of malignancy.  She had a TIA on Christmas Eve 2019, and had double vision for weeks afterwards.  She is now on aspirin 81 mg daily in addition to her usual medications.  She underwent a bone density scan back in July 2020 which revealed mild osteopenia of the hip and spine.  T-score of -1.4 of the right femur neck, previously -0.9.  Dual femur total mean is normal at -0.9, previously -0.2.  AP spine measures -1.1, previously -0.8, and is considered osteopenic. She will need to remain on calcium  and vitamin D  supplement.  INTERVAL HISTORY:  Victoria Parsons is here for routine follow up for stage IIA left breast cancer. She had her hysterectomy in 2023 which  revealed chronic cervicitis but benign histology with no dysplasia. Patient states that she feels well and has no complaints of pain. She completed Anastrozole  in August, 2023. Bone density scan done on 04/14/2023 revealed osteopenia of the spine with a T-score of -1.2 and osteopenia of the femur with a T-score of -1.4. She also had a screening bilateral mammogram done on 01/14/2024 was clear. I will see her back in 1 year with screening bilateral mammogram. She denies signs of infection such as sore throat, sinus drainage, cough, or urinary symptoms.  She denies fevers or recurrent chills. She denies pain. She denies nausea, vomiting, chest pain, dyspnea or cough. Her appetite is good and her weight has increased 13 pounds over last 11 months .   REVIEW OF SYSTEMS:  Review of Systems  Constitutional: Negative.  Negative for appetite change, chills, diaphoresis, fatigue, fever and unexpected weight change.  HENT:  Negative.  Negative for hearing loss, lump/mass, mouth sores, nosebleeds, sore throat, tinnitus, trouble swallowing and voice change.   Eyes: Negative.  Negative for eye problems and icterus.  Respiratory: Negative.  Negative for chest tightness, cough, hemoptysis, shortness of breath and wheezing.   Cardiovascular: Negative.  Negative for chest pain, leg swelling and palpitations.  Gastrointestinal: Negative.  Negative for abdominal distention, abdominal pain, blood in stool, constipation, diarrhea, nausea, rectal pain and vomiting.  Endocrine: Negative.   Genitourinary: Negative.  Negative for bladder incontinence, difficulty urinating,  dyspareunia, dysuria, frequency, menstrual problem, nocturia, pelvic pain, vaginal bleeding and vaginal discharge.   Musculoskeletal: Negative.  Negative for arthralgias, back pain, flank pain, gait problem, myalgias, neck pain and neck stiffness.  Skin: Negative.  Negative for itching, rash and wound.  Neurological: Negative.  Negative for dizziness,  extremity weakness, gait problem, headaches, light-headedness, numbness, seizures and speech difficulty.  Hematological: Negative.  Negative for adenopathy. Does not bruise/bleed easily.  Psychiatric/Behavioral: Negative.  Negative for confusion, decreased concentration, depression, sleep disturbance and suicidal ideas. The patient is not nervous/anxious.      VITALS:  Blood pressure (!) 154/78, pulse 68, temperature (!) 97.5 F (36.4 C), temperature source Oral, resp. rate 16, height 5' 2.5" (1.588 m), weight 213 lb 4.8 oz (96.8 kg), SpO2 98%.  Wt Readings from Last 3 Encounters:  01/21/24 214 lb (97.1 kg)  01/20/24 213 lb 4.8 oz (96.8 kg)  07/15/23 203 lb 6.4 oz (92.3 kg)    Body mass index is 38.39 kg/m.  Performance status (ECOG): 0 - Asymptomatic  PHYSICAL EXAM:  Physical Exam Vitals and nursing note reviewed.  Constitutional:      General: She is not in acute distress.    Appearance: Normal appearance. She is normal weight. She is not ill-appearing, toxic-appearing or diaphoretic.  HENT:     Head: Normocephalic and atraumatic.     Right Ear: Tympanic membrane, ear canal and external ear normal. There is no impacted cerumen.     Left Ear: Tympanic membrane, ear canal and external ear normal. There is no impacted cerumen.     Nose: Nose normal. No congestion or rhinorrhea.     Mouth/Throat:     Mouth: Mucous membranes are moist.     Pharynx: Oropharynx is clear. No oropharyngeal exudate or posterior oropharyngeal erythema.  Eyes:     General: No scleral icterus.       Right eye: No discharge.        Left eye: No discharge.     Extraocular Movements: Extraocular movements intact.     Conjunctiva/sclera: Conjunctivae normal.     Pupils: Pupils are equal, round, and reactive to light.  Neck:     Vascular: No carotid bruit.     Comments: Small scar at the anterior neck Cardiovascular:     Rate and Rhythm: Normal rate and regular rhythm.     Pulses: Normal pulses.      Heart sounds: Normal heart sounds. No murmur heard.    No friction rub. No gallop.  Pulmonary:     Effort: Pulmonary effort is normal. No respiratory distress.     Breath sounds: Normal breath sounds. No stridor. No wheezing, rhonchi or rales.  Chest:     Chest wall: No tenderness.  Breasts:    Right: Normal. No mass.     Left: Normal. No mass.     Comments: Barely visible scar just above the areolar complex at 11 o'clock with some nodularity above that consistent with scar tissue  Both breasts are without masses. Abdominal:     General: Bowel sounds are normal. There is no distension.     Palpations: Abdomen is soft. There is no hepatomegaly, splenomegaly or mass.     Tenderness: There is no abdominal tenderness. There is no right CVA tenderness, left CVA tenderness, guarding or rebound.     Hernia: No hernia is present.  Musculoskeletal:        General: No swelling, tenderness, deformity or signs of injury. Normal range of motion.  Cervical back: Normal range of motion and neck supple. No rigidity or tenderness.     Right lower leg: No edema.     Left lower leg: No edema.  Lymphadenopathy:     Cervical: No cervical adenopathy.     Right cervical: No superficial, deep or posterior cervical adenopathy.    Left cervical: No superficial, deep or posterior cervical adenopathy.     Upper Body:     Right upper body: No supraclavicular, axillary or pectoral adenopathy.     Left upper body: No supraclavicular, axillary or pectoral adenopathy.  Skin:    General: Skin is warm and dry.     Coloration: Skin is not jaundiced or pale.     Findings: No bruising, erythema, lesion or rash.  Neurological:     General: No focal deficit present.     Mental Status: She is alert and oriented to person, place, and time. Mental status is at baseline.     Cranial Nerves: No cranial nerve deficit.     Sensory: No sensory deficit.     Motor: No weakness.     Coordination: Coordination normal.      Gait: Gait normal.     Deep Tendon Reflexes: Reflexes normal.  Psychiatric:        Mood and Affect: Mood normal.        Behavior: Behavior normal.        Thought Content: Thought content normal.        Judgment: Judgment normal.     LABS:      Latest Ref Rng & Units 01/21/2024    8:55 AM 07/15/2023    8:37 AM 01/07/2023    8:41 AM  CBC  WBC 3.4 - 10.8 x10E3/uL 7.1  7.3  6.9   Hemoglobin 11.1 - 15.9 g/dL 16.1  09.6  04.5   Hematocrit 34.0 - 46.6 % 44.5  47.3  44.5   Platelets 150 - 450 x10E3/uL 286  294  287       Latest Ref Rng & Units 01/21/2024    8:55 AM 07/15/2023    8:37 AM 01/07/2023    8:41 AM  CMP  Glucose 70 - 99 mg/dL 92  86  95   BUN 8 - 27 mg/dL 20  19  20    Creatinine 0.57 - 1.00 mg/dL 4.09  8.11  9.14   Sodium 134 - 144 mmol/L 143  141  142   Potassium 3.5 - 5.2 mmol/L 4.8  4.8  4.7   Chloride 96 - 106 mmol/L 105  105  105   CO2 20 - 29 mmol/L 25  22  22    Calcium  8.7 - 10.3 mg/dL 9.8  9.4  9.9   Total Protein 6.0 - 8.5 g/dL 6.6  6.7  7.0   Total Bilirubin 0.0 - 1.2 mg/dL 0.3  0.5  0.4   Alkaline Phos 44 - 121 IU/L 113  115  136   AST 0 - 40 IU/L 20  20  22    ALT 0 - 32 IU/L 25  22  26      STUDIES:  EXAM: 01/14/2024 DIGITAL SCREENING BILATERAL MAMMOGRAM WITH TOMOSYNTHESIS AND CAD IMPRESSION: No mammographic evidence of malignancy. A result letter of this screening mammogram will be mailed directly to the patient. Allergies:  Allergies  Allergen Reactions   Penicillins Other (See Comments) and Hives    DID THE REACTION INVOLVE: Swelling of the face/tongue/throat, SOB, or low BP? Yes  Sudden or severe rash/hives,  skin peeling, or the inside of the mouth or nose? Yes  Did it require medical treatment? Yes  When did it last happen? 15-20 YRS AGO     If all above answers are "NO", may proceed with cephalosporin use.  DID THE REACTION INVOLVE: Swelling of the face/tongue/throat, SOB, or low BP? Yes, Sudden or severe rash/hives, skin peeling, or the  inside of the mouth or nose? Yes, Did it require medical treatment? Yes, When did it last happen? 15-20 YRS AGO   , If all above answers are "NO", may proceed with cephalosporin use.   Codeine Nausea Only    Current Medications: Current Outpatient Medications  Medication Sig Dispense Refill   albuterol  (VENTOLIN  HFA) 108 (90 Base) MCG/ACT inhaler Inhale 2 puffs into the lungs every 6 (six) hours as needed for wheezing or shortness of breath. 8 g 2   aspirin EC 81 MG tablet Take 81 mg by mouth daily. Swallow whole.     atorvastatin  (LIPITOR) 40 MG tablet TAKE 1 TABLET DAILY 90 tablet 0   Cholecalciferol 125 MCG (5000 UT) capsule Take 50,000 Units by mouth once a week.     Coenzyme Q10 (CO Q-10) 100 MG CAPS Take 100 mg by mouth daily.     estradiol  (ESTRACE ) 0.1 MG/GM vaginal cream Place 1 Applicatorful vaginally 2 (two) times a week. 42.5 g 5   loratadine (CLARITIN) 10 MG tablet Take 10 mg by mouth at bedtime.      Omega-3 Fatty Acids (FISH OIL) 1200 MG CAPS      pantoprazole  (PROTONIX ) 40 MG tablet TAKE 1 TABLET DAILY 90 tablet 0   Probiotic Product (PROBIOTIC PO) Take 1 capsule by mouth daily. Provitalize probiotic     rOPINIRole  (REQUIP ) 0.5 MG tablet Take 1 tablet (0.5 mg total) by mouth at bedtime. 30 tablet 2   sodium chloride  (OCEAN) 0.65 % SOLN nasal spray Place 1 spray into both nostrils as needed for congestion.     No current facility-administered medications for this visit.   ASSESSMENT & PLAN:  Assessment:   1.  History of stage IIA hormone receptor positive breast cancer diagnosed in March 2018.  She remains without evidence of recurrence.  She was placed on anastrozole  1 mg daily in August 2018, and she completed 5 years in August, 2023.    2.  Mild osteopenia.  Repeat bone density in July 2022 was relatively stable. Bone density scan done on 04/14/2023 revealed osteopenia of the spine with a T-score of -1.2 and osteopenia of the femur with a T-score of -1.4.   3.  Tobacco  abuse.  She quit smoking and I praised her efforts and emphasized the importance of staying with this.    4.  History of a TIA in December 2019.  5.  Atrophic vaginitis. I refilled her estradiol  cream.   6.  Chronic hypercalcemia.  She continues to take vitamin D  for her osteopenia. She has had a parathyroidectomy.   Plan: She completed Anastrozole  in August, 2023. Bone density scan done on 04/14/2023 revealed osteopenia of the spine with a T-score of -1.2 and osteopenia of the femur with a T-score of -1.4. She also had a screening bilateral mammogram done on 01/14/2024 and this was clear. I will see her back in 1 year with screening bilateral mammogram. The patient understands the plans discussed today and is in agreement with them.  She knows to contact our office if she develops concerns regarding her breast cancer or its treatment  I provided 15 minutes of face-to-face time during this this encounter and > 50% was spent counseling as documented under my assessment and plan.   Nolia Baumgartner, MD  [facility] - A DEPT OF [facility] [address] [place] [address] Dept: [phone] Dept Fax: [phone]  No orders of the defined types were placed in this encounter.   I,Jasmine M Lassiter,acting as a scribe for Nolia Baumgartner, MD.,have documented all relevant documentation on the behalf of Nolia Baumgartner, MD,as directed by  Nolia Baumgartner, MD while in the presence of Nolia Baumgartner, MD.

## 2024-01-20 ENCOUNTER — Encounter: Payer: Self-pay | Admitting: Oncology

## 2024-01-20 ENCOUNTER — Inpatient Hospital Stay: Payer: Commercial Managed Care - HMO | Attending: Oncology | Admitting: Oncology

## 2024-01-20 ENCOUNTER — Other Ambulatory Visit: Payer: Self-pay | Admitting: Oncology

## 2024-01-20 VITALS — BP 154/78 | HR 68 | Temp 97.5°F | Resp 16 | Ht 62.5 in | Wt 213.3 lb

## 2024-01-20 DIAGNOSIS — Z08 Encounter for follow-up examination after completed treatment for malignant neoplasm: Secondary | ICD-10-CM | POA: Diagnosis present

## 2024-01-20 DIAGNOSIS — Z87891 Personal history of nicotine dependence: Secondary | ICD-10-CM | POA: Diagnosis not present

## 2024-01-20 DIAGNOSIS — C50912 Malignant neoplasm of unspecified site of left female breast: Secondary | ICD-10-CM

## 2024-01-20 DIAGNOSIS — N952 Postmenopausal atrophic vaginitis: Secondary | ICD-10-CM

## 2024-01-20 DIAGNOSIS — M8589 Other specified disorders of bone density and structure, multiple sites: Secondary | ICD-10-CM | POA: Diagnosis not present

## 2024-01-20 DIAGNOSIS — Z853 Personal history of malignant neoplasm of breast: Secondary | ICD-10-CM | POA: Insufficient documentation

## 2024-01-20 DIAGNOSIS — Z79899 Other long term (current) drug therapy: Secondary | ICD-10-CM | POA: Diagnosis not present

## 2024-01-20 DIAGNOSIS — Z8673 Personal history of transient ischemic attack (TIA), and cerebral infarction without residual deficits: Secondary | ICD-10-CM | POA: Diagnosis not present

## 2024-01-20 MED ORDER — ESTRADIOL 0.1 MG/GM VA CREA: 1.0000 | TOPICAL_CREAM | VAGINAL | 5 refills | Status: AC

## 2024-01-21 ENCOUNTER — Encounter: Payer: Self-pay | Admitting: Physician Assistant

## 2024-01-21 ENCOUNTER — Ambulatory Visit (INDEPENDENT_AMBULATORY_CARE_PROVIDER_SITE_OTHER): Payer: Managed Care, Other (non HMO) | Admitting: Physician Assistant

## 2024-01-21 VITALS — BP 144/78 | HR 74 | Temp 98.0°F | Resp 16 | Ht 62.5 in | Wt 214.0 lb

## 2024-01-21 DIAGNOSIS — E559 Vitamin D deficiency, unspecified: Secondary | ICD-10-CM | POA: Diagnosis not present

## 2024-01-21 DIAGNOSIS — J060 Acute laryngopharyngitis: Secondary | ICD-10-CM

## 2024-01-21 DIAGNOSIS — C50912 Malignant neoplasm of unspecified site of left female breast: Secondary | ICD-10-CM

## 2024-01-21 DIAGNOSIS — I700 Atherosclerosis of aorta: Secondary | ICD-10-CM

## 2024-01-21 DIAGNOSIS — E210 Primary hyperparathyroidism: Secondary | ICD-10-CM

## 2024-01-21 DIAGNOSIS — Z720 Tobacco use: Secondary | ICD-10-CM

## 2024-01-21 DIAGNOSIS — E782 Mixed hyperlipidemia: Secondary | ICD-10-CM

## 2024-01-21 DIAGNOSIS — G2581 Restless legs syndrome: Secondary | ICD-10-CM

## 2024-01-21 DIAGNOSIS — J438 Other emphysema: Secondary | ICD-10-CM

## 2024-01-21 DIAGNOSIS — K219 Gastro-esophageal reflux disease without esophagitis: Secondary | ICD-10-CM

## 2024-01-21 MED ORDER — ALBUTEROL SULFATE HFA 108 (90 BASE) MCG/ACT IN AERS: 2.0000 | INHALATION_SPRAY | Freq: Four times a day (QID) | RESPIRATORY_TRACT | 2 refills | Status: DC | PRN

## 2024-01-21 MED ORDER — AZITHROMYCIN 250 MG PO TABS: ORAL_TABLET | ORAL | 0 refills | Status: AC

## 2024-01-21 MED ORDER — ROPINIROLE HCL 0.5 MG PO TABS: 0.5000 mg | ORAL_TABLET | Freq: Every day | ORAL | 2 refills | Status: DC

## 2024-01-21 NOTE — Progress Notes (Signed)
 Acute Office Visit  Subjective:    Patient ID: Victoria Parsons, female    DOB: 04-26-1961, 63 y.o.   MRN: [id number]  Chief Complaint  Patient presents with   Medical Management of Chronic Issues    Hyperlipidemia   Patient is in today for follow up hyperlipidemia Pt presents with hyperlipidemia.Compliance with treatment has been goodThe patient is compliant with medications, maintains a low cholesterol diet , follows up as directed , and maintains an exercise regimen . The patient denies experiencing any hypercholesterolemia related symptoms. Pt currently on lipitor 40mg  qd, fish oil and co Q10 - she did have aortic atherosclerosis noted on CT  Pt with history of breast cancer - she currently follows with oncology every 6 months - Dr Victoria Parsons - symptoms have been stable -is up to date on mammogram  Pt with hypercalcemia - has seen endocrinologist and has been advised not to take any calcium  supplements   Pt with GERD - symptoms stable on protonix  40mg  qd  Pt was noted to have emphysema on lung CT - she would like rx for rescue inhaler at this time but states she does not have a lot of breathing issues otherwise and defers maintenance med Is due to repeat CT and will schedule Pt does complain of sinus pain and pressure pnd  Pt requests medication to help with restless leg syndrome -- says legs ache and feel jumpy at night  Past Medical History:  Diagnosis Date   Breast cancer    on Anastrazole - 5 years will be fall 2023   Breast cancer (HCC)    Complication of anesthesia    one breast surgery, she woke up with itching all over   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    Hypercalcemia    Hypercholesterolemia    Osteopenia 04/19/2019   Sixth (abducent) nerve palsy, right eye 2019   from TIA in 2019 is now resolved   Stroke (HCC) 2019   hx TIA   Tobacco abuse    quit 11/17/21 and takes wellbutrin  while stopping smoking    Past Surgical History:   Procedure Laterality Date   BREAST LUMPECTOMY Left 2014   benign   BREAST LUMPECTOMY Left 2018   breast ca   BREAST SURGERY Left 2018   CA   CERVICAL CONE BIOPSY N/A 11/2017   CERVICAL CONE BIOPSY N/A 11/2019   PARATHYROIDECTOMY Right 06/19/2022   Procedure: RIGHT INFERIOR PARATHYROIDECTOMY;  Surgeon: Victoria Boas, MD;  Location: [facility];  Service: General;  Laterality: Right;   pilonidal cyst N/A    procedure in high school   ROBOTIC ASSISTED TOTAL HYSTERECTOMY WITH BILATERAL SALPINGO OOPHERECTOMY Bilateral 12/09/2021   Procedure: XI ROBOTIC ASSISTED TOTAL HYSTERECTOMY WITH BILATERAL SALPINGO OOPHORECTOMY;  Surgeon: Victoria Comer SAUNDERS, MD;  Location: [facility];  Service: Gynecology;  Laterality: Bilateral;   TONSILLECTOMY Right    as a child   WISDOM TOOTH EXTRACTION      Family History  Problem Relation Age of Onset   Hypertension Mother    Hyperlipidemia Mother    Diabetes Mother    Arthritis Mother    Alzheimer's disease Mother    Osteoarthritis Mother    Hypertension Father    Hyperlipidemia Father    Diabetes Father    Coronary artery disease Father    Hypothyroidism Sister    Anxiety disorder Sister    Depression Sister    Breast cancer Paternal Aunt    Breast cancer Maternal Grandmother  Breast cancer Cousin    Hyperparathyroidism Neg Hx    Pancreatic cancer Neg Hx    Prostate cancer Neg Hx    Colon cancer Neg Hx    Ovarian cancer Neg Hx    Endometrial cancer Neg Hx     Social History   Socioeconomic History   Marital status: Married    Spouse name: Not on file   Number of children: 1   Years of education: Not on file   Highest education level: Associate degree: occupational, scientist, product/process development, or vocational program  Occupational History    Employer: [employer]  Tobacco Use   Smoking status: Former    Current packs/day: 0.00    Average packs/day: 0.5 packs/day for 40.0 years (20.0 ttl pk-yrs)    Types: Cigarettes    Start date: 11/17/1981    Quit date:  11/17/2021    Years since quitting: 2.1   Smokeless tobacco: Never  Vaping Use   Vaping status: Never Used  Substance and Sexual Activity   Alcohol use: Not Currently   Drug use: Never   Sexual activity: Not Currently  Other Topics Concern   Not on file  Social History Narrative   Not on file   Social Drivers of Health   Financial Resource Strain: Low Risk  (01/19/2024)   Overall Financial Resource Strain (CARDIA)    Difficulty of Paying Living Expenses: Not very hard  Food Insecurity: No Food Insecurity (01/19/2024)   Hunger Vital Sign    Worried About Running Out of Food in the Last Year: Never true    Ran Out of Food in the Last Year: Never true  Transportation Needs: No Transportation Needs (01/19/2024)   PRAPARE - Administrator, Civil Service (Medical): No    Lack of Transportation (Non-Medical): No  Physical Activity: Insufficiently Active (01/19/2024)   Exercise Vital Sign    Days of Exercise per Week: 2 days    Minutes of Exercise per Session: 10 min  Stress: No Stress Concern Present (01/19/2024)   [employer] of Occupational Health - Occupational Stress Questionnaire    Feeling of Stress : Only a little  Social Connections: Unknown (01/19/2024)   Social Connection and Isolation Panel [NHANES]    Frequency of Communication with Friends and Family: Once a week    Frequency of Social Gatherings with Friends and Family: Once a week    Attends Religious Services: Patient declined    Database Administrator or Organizations: No    Attends Banker Meetings: Never    Marital Status: Married  Catering Manager Violence: Not At Risk (07/15/2023)   Humiliation, Afraid, Rape, and Kick questionnaire    Fear of Current or Ex-Partner: No    Emotionally Abused: No    Physically Abused: No    Sexually Abused: No     Current Outpatient Medications:    albuterol  (VENTOLIN  HFA) 108 (90 Base) MCG/ACT inhaler, Inhale 2 puffs into the lungs every 6 (six)  hours as needed for wheezing or shortness of breath., Disp: 8 g, Rfl: 2   aspirin EC 81 MG tablet, Take 81 mg by mouth daily. Swallow whole., Disp: , Rfl:    atorvastatin  (LIPITOR) 40 MG tablet, TAKE 1 TABLET DAILY, Disp: 90 tablet, Rfl: 0   azithromycin  (ZITHROMAX ) 250 MG tablet, Take 2 tablets on day 1, then 1 tablet daily on days 2 through 5, Disp: 6 tablet, Rfl: 0   Cholecalciferol 125 MCG (5000 UT) capsule, Take 50,000 Units by mouth  once a week., Disp: , Rfl:    Coenzyme Q10 (CO Q-10) 100 MG CAPS, Take 100 mg by mouth daily., Disp: , Rfl:    estradiol  (ESTRACE ) 0.1 MG/GM vaginal cream, Place 1 Applicatorful vaginally 2 (two) times a week., Disp: 42.5 g, Rfl: 5   loratadine (CLARITIN) 10 MG tablet, Take 10 mg by mouth at bedtime. , Disp: , Rfl:    Omega-3 Fatty Acids (FISH OIL) 1200 MG CAPS, , Disp: , Rfl:    pantoprazole  (PROTONIX ) 40 MG tablet, TAKE 1 TABLET DAILY, Disp: 90 tablet, Rfl: 0   Probiotic Product (PROBIOTIC PO), Take 1 capsule by mouth daily. Provitalize probiotic, Disp: , Rfl:    rOPINIRole  (REQUIP ) 0.5 MG tablet, Take 1 tablet (0.5 mg total) by mouth at bedtime., Disp: 30 tablet, Rfl: 2   sodium chloride  (OCEAN) 0.65 % SOLN nasal spray, Place 1 spray into both nostrils as needed for congestion., Disp: , Rfl:    Allergies  Allergen Reactions   Penicillins Other (See Comments) and Hives    DID THE REACTION INVOLVE: Swelling of the face/tongue/throat, SOB, or low BP? Yes  Sudden or severe rash/hives, skin peeling, or the inside of the mouth or nose? Yes  Did it require medical treatment? Yes  When did it last happen? 15-20 YRS AGO     If all above answers are NO, may proceed with cephalosporin use.  DID THE REACTION INVOLVE: Swelling of the face/tongue/throat, SOB, or low BP? Yes, Sudden or severe rash/hives, skin peeling, or the inside of the mouth or nose? Yes, Did it require medical treatment? Yes, When did it last happen? 15-20 YRS AGO   , If all above answers are  NO, may proceed with cephalosporin use.   Codeine Nausea Only  CONSTITUTIONAL: Negative for chills, fatigue, fever, unintentional weight gain and unintentional weight loss.  E/N/T: Negative for ear pain, nasal congestion and sore throat.  CARDIOVASCULAR: Negative for chest pain, dizziness, palpitations and pedal edema.  RESPIRATORY: Negative for recent cough and dyspnea.  GASTROINTESTINAL: Negative for abdominal pain, acid reflux symptoms, constipation, diarrhea, nausea and vomiting.  MSK: see HPI INTEGUMENTARY: Negative for rash.  NEUROLOGICAL: Negative for dizziness and headaches.  PSYCHIATRIC: Negative for sleep disturbance and to question depression screen.  Negative for depression, negative for anhedonia.       Objective:  PHYSICAL EXAM:   VS: BP (!) 144/78   Pulse 74   Temp 98 F (36.7 C) (Temporal)   Resp 16   Ht 5' 2.5 (1.588 m)   Wt 214 lb (97.1 kg)   SpO2 98%   BMI 38.52 kg/m   GEN: Well nourished, well developed, in no acute distress   Cardiac: RRR; no murmurs, rubs, or gallops,no edema - Respiratory:  normal respiratory rate and pattern with no distress - normal breath sounds with no rales, rhonchi, wheezes or rubs GI: normal bowel sounds, no masses or tenderness MS: no deformity or atrophy  Skin: warm and dry, no rash  Neuro:  Alert and Oriented x 3, Strength and sensation are intact - CN II-Xii grossly intact Psych: euthymic mood, appropriate affect and demeanor   Health Maintenance Due  Topic Date Due   Lung Cancer Screening  02/08/2024    There are no preventive care reminders to display for this patient.        Assessment & Plan:   Problem List Items Addressed This Visit       Other   Mixed hyperlipidemia - Primary   Relevant  Orders   CBC with Differential/Platelet   Comprehensive metabolic panel   Lipid panel Continue meds as directed and watch diet       Infiltrating ductal carcinoma of left breast (HCC) follow up with oncology as  directed      Hypercalcemia Continue follow up with endocrinologist  GERD  Rx for protonix       URI Rx zpack as directed  Restless leg syndrome Rx requip  0.5mg  qd        Meds ordered this encounter  Medications   albuterol  (VENTOLIN  HFA) 108 (90 Base) MCG/ACT inhaler    Sig: Inhale 2 puffs into the lungs every 6 (six) hours as needed for wheezing or shortness of breath.    Dispense:  8 g    Refill:  2    Supervising Provider:   COX, KIRSTEN [[id number]]   azithromycin  (ZITHROMAX ) 250 MG tablet    Sig: Take 2 tablets on day 1, then 1 tablet daily on days 2 through 5    Dispense:  6 tablet    Refill:  0    Supervising Provider:   COX, KIRSTEN [[id number]]   rOPINIRole  (REQUIP ) 0.5 MG tablet    Sig: Take 1 tablet (0.5 mg total) by mouth at bedtime.    Dispense:  30 tablet    Refill:  2    Supervising Provider:   COX, KIRSTEN [[id number]]     SARA R Havier Deeb, PA-C

## 2024-01-22 LAB — COMPREHENSIVE METABOLIC PANEL WITH GFR
ALT: 25 IU/L (ref 0–32)
AST: 20 IU/L (ref 0–40)
Albumin: 4.5 g/dL (ref 3.9–4.9)
Alkaline Phosphatase: 113 IU/L (ref 44–121)
BUN/Creatinine Ratio: 20 (ref 12–28)
BUN: 20 mg/dL (ref 8–27)
Bilirubin Total: 0.3 mg/dL (ref 0.0–1.2)
CO2: 25 mmol/L (ref 20–29)
Calcium: 9.8 mg/dL (ref 8.7–10.3)
Chloride: 105 mmol/L (ref 96–106)
Creatinine, Ser: 0.98 mg/dL (ref 0.57–1.00)
Globulin, Total: 2.1 g/dL (ref 1.5–4.5)
Glucose: 92 mg/dL (ref 70–99)
Potassium: 4.8 mmol/L (ref 3.5–5.2)
Sodium: 143 mmol/L (ref 134–144)
Total Protein: 6.6 g/dL (ref 6.0–8.5)
eGFR: 65 mL/min/{1.73_m2} (ref >59)

## 2024-01-22 LAB — CBC WITH DIFFERENTIAL/PLATELET
Basophils Absolute: 0.1 10*3/uL (ref 0.0–0.2)
Basos: 1 %
EOS (ABSOLUTE): 0.1 10*3/uL (ref 0.0–0.4)
Eos: 2 %
Hematocrit: 44.5 % (ref 34.0–46.6)
Hemoglobin: 14.6 g/dL (ref 11.1–15.9)
Immature Grans (Abs): 0 10*3/uL (ref 0.0–0.1)
Immature Granulocytes: 0 %
Lymphocytes Absolute: 2 10*3/uL (ref 0.7–3.1)
Lymphs: 28 %
MCH: 30.7 pg (ref 26.6–33.0)
MCHC: 32.8 g/dL (ref 31.5–35.7)
MCV: 94 fL (ref 79–97)
Monocytes Absolute: 0.6 10*3/uL (ref 0.1–0.9)
Monocytes: 9 %
Neutrophils Absolute: 4.3 10*3/uL (ref 1.4–7.0)
Neutrophils: 60 %
Platelets: 286 10*3/uL (ref 150–450)
RBC: 4.76 x10E6/uL (ref 3.77–5.28)
RDW: 12.6 % (ref 11.7–15.4)
WBC: 7.1 10*3/uL (ref 3.4–10.8)

## 2024-01-22 LAB — LIPID PANEL
Chol/HDL Ratio: 4 ratio (ref 0.0–4.4)
Cholesterol, Total: 149 mg/dL (ref 100–199)
HDL: 37 mg/dL — ABNORMAL LOW (ref >39)
LDL Chol Calc (NIH): 93 mg/dL (ref 0–99)
Triglycerides: 104 mg/dL (ref 0–149)
VLDL Cholesterol Cal: 19 mg/dL (ref 5–40)

## 2024-01-22 LAB — VITAMIN D 25 HYDROXY (VIT D DEFICIENCY, FRACTURES): Vit D, 25-Hydroxy: 80.5 ng/mL (ref 30.0–100.0)

## 2024-01-22 LAB — TSH: TSH: 1.7 u[IU]/mL (ref 0.450–4.500)

## 2024-01-24 ENCOUNTER — Encounter: Payer: Self-pay | Admitting: Physician Assistant

## 2024-02-02 ENCOUNTER — Ambulatory Visit: Payer: Commercial Managed Care - HMO | Admitting: Internal Medicine

## 2024-02-02 ENCOUNTER — Encounter: Payer: Self-pay | Admitting: Internal Medicine

## 2024-02-02 VITALS — BP 120/80 | HR 73 | Ht 62.5 in | Wt 211.6 lb

## 2024-02-02 DIAGNOSIS — E041 Nontoxic single thyroid nodule: Secondary | ICD-10-CM | POA: Diagnosis not present

## 2024-02-02 DIAGNOSIS — R748 Abnormal levels of other serum enzymes: Secondary | ICD-10-CM | POA: Diagnosis not present

## 2024-02-02 DIAGNOSIS — E210 Primary hyperparathyroidism: Secondary | ICD-10-CM

## 2024-02-02 DIAGNOSIS — M8588 Other specified disorders of bone density and structure, other site: Secondary | ICD-10-CM | POA: Diagnosis not present

## 2024-02-02 DIAGNOSIS — E559 Vitamin D deficiency, unspecified: Secondary | ICD-10-CM

## 2024-02-02 NOTE — Patient Instructions (Addendum)
 Please continue the vitamin D  50,000 units but take it every 2 weeks.  Try to build up weight bearing exercises.  Please come back for a follow-up appointment in 1 year.

## 2024-02-02 NOTE — Progress Notes (Signed)
 Patient ID: Victoria Parsons, female   DOB: 1961-08-14, 63 y.o.   MRN: [id number]  HPI  Victoria Parsons is a 63 y.o.-year-old female, returning for follow-up for hypercalcemia/hyperparathyroidism.  She previously saw Dr. Washington Hacker, last visit 6 months ago.  Interim history: She feels well, without complaints today.   At last visit, she was in PT and had dry needling for the R hip pain.  She is still not able to exercise due to this and she also mentions that she is not motivated enough to start.  She is not able to walk outside [place] walk safely.  Reviewed and addended history: She had parathyroidectomy by Dr. Sofia Dunn on 06/19/2022.  The surgery, she still had mm/joint pains (R hip - in PT), irritability, and fatigue (she admitted that this improved a little).   She had a busy year and 2023: She quit smoking in 10/2021 and then had  TAH + BSO in 11/2021, and then parathyroidectomy.  Pt was dx with hypercalcemia in 2021. I reviewed pt's pertinent labs: 07/01/2022: PTH 43 Lab Results  Component Value Date   PTH 110 (H) 01/26/2022   PTH 85 (H) 01/29/2021   PTH Comment 01/29/2021   PTH 41 04/11/2020   PTH Comment 04/11/2020   CALCIUM  9.8 01/21/2024   CALCIUM  9.4 07/15/2023   CALCIUM  9.9 01/07/2023   CALCIUM  9.3 07/01/2022   CALCIUM  10.7 (H) 06/12/2022   CALCIUM  11.4 (H) 01/26/2022   CALCIUM  11.3 (H) 12/25/2021   CALCIUM  10.4 (H) 11/28/2021   CALCIUM  11.1 (H) 06/26/2021   CALCIUM  11.3 (H) 05/01/2021   Component     Latest Ref Rng 05/30/2021  Calcium , Urine     Not Estab. mg/dL 11.9   Calcium , 24H Urine     0 - 320 mg/24 hr 155    Component     Latest Ref Rng 05/01/2021  Vitamin A (Retinoic Acid)     38 - 98 mcg/dL 60   PTH-Related Protein (PTH-RP)     11 - 20 pg/mL 8 (L)    Parathyroid  technetium scan (05/21/2022): Consistent with right inferior adenoma  Thyroid  ultrasound (05/21/2022): no lesion corresponding to the  inferior parathyroid  adenoma, 4 mm left thyroid  nodule  Parathyroidectomy (06/19/2022) - Dr Sofia Dunn: Hypercellular parathyroid  tissue, consistent with parathyroid  adenoma (2g, 2.5 x 1.5 x 0.8 cm)  DXA ([facility]): Osteopenia     No h/o kidney stones.  No h/o CKD. Last BUN/Cr: Lab Results  Component Value Date   BUN 20 01/21/2024   BUN 19 07/15/2023   CREATININE 0.98 01/21/2024   CREATININE 0.90 07/15/2023   She was on Anastrozole  >> stopped 06/2022.  No h/o vitamin D  deficiency. Reviewed vit D levels: Lab Results  Component Value Date   VD25OH 80.5 01/21/2024   VD25OH 90.7 07/15/2023   VD25OH 72.4 01/07/2023   VD25OH 47.1 07/01/2022   VD25OH 55.2 12/25/2021   VD25OH 27.7 (L) 06/26/2021   VD25OH 30.47 05/01/2021   Pt is  on high dose vitamin D  50,000 units weekly.  Pt. also has a history of elevated alkaline phosphatase: Lab Results  Component Value Date   ALKPHOS 113 01/21/2024   ALKPHOS 115 07/15/2023   ALKPHOS 136 (H) 01/07/2023   ALKPHOS 173 (H) 07/01/2022   ALKPHOS 165 (H) 01/26/2022   ALKPHOS 171 (H) 12/25/2021   ALKPHOS 137 (H) 11/28/2021   ALKPHOS 171 (H) 06/26/2021   ALKPHOS 174 (H) 05/01/2021  ALKPHOS 152 (H) 12/24/2020   ALKPHOS 144 (H) 06/18/2020   ALKPHOS 139 (H) 05/13/2020   ALKPHOS 124 (H) 03/27/2020   ALKPHOS 122 (H) 12/15/2019   ALKPHOS 92 09/29/2018   Component Ref Range & Units 6 mo ago  ALKALINE PHOSPHATASE, BONE SPECIFIC 5.6 - 29.0 mcg/L 27.2   Comment: .  Reference Range, Premenopausal (mcg/L)   35-45 years            5.0-18.2    Component     Latest Ref Rng 01/26/2022  Alkaline Phosphatase     44 - 121 IU/L 165 (H)   LIVER FRACTION     18 - 85 % 61   BONE FRACTION     14 - 68 % 36   INTESTINAL FRAC.     0 - 18 % 3   ALKALINE PHOSPHATASE, BONE SPECIFIC     5.6 - 29.0 mcg/L 27.2     Component     Latest Ref Rng 05/01/2021  Alkaline Phosphatase     44 - 121 IU/L 174 (H)   LIVER FRACTION     18 - 85 % 49   BONE  FRACTION     14 - 68 % 50   INTESTINAL FRAC.     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    Hypercalcemia    Hypercholesterolemia    Osteopenia 04/19/2019   Sixth (abducent) nerve palsy, right eye 2019   from TIA in 2019 is now resolved   Stroke (HCC) 2019   hx TIA   Tobacco abuse    quit 11/17/21 and takes wellbutrin  while stopping smoking   Past Surgical History:  Procedure Laterality Date   BREAST LUMPECTOMY Left 2014   benign   BREAST LUMPECTOMY Left 2018   breast ca   BREAST SURGERY Left 2018   CA   CERVICAL CONE BIOPSY N/A 11/2017   CERVICAL CONE BIOPSY N/A 11/2019   PARATHYROIDECTOMY Right 06/19/2022   Procedure: RIGHT INFERIOR PARATHYROIDECTOMY;  Surgeon: Oralee Billow, MD;  Location: [facility];  Service: General;  Laterality: Right;   pilonidal cyst N/A    procedure in high school   ROBOTIC ASSISTED TOTAL HYSTERECTOMY WITH BILATERAL SALPINGO OOPHERECTOMY Bilateral 12/09/2021   Procedure: XI ROBOTIC ASSISTED TOTAL HYSTERECTOMY WITH BILATERAL SALPINGO OOPHORECTOMY;  Surgeon: Suzi Essex, MD;  Location: [facility];  Service: Gynecology;  Laterality: Bilateral;   TONSILLECTOMY Right    as a child   WISDOM TOOTH EXTRACTION     Social History   Socioeconomic History   Marital status: Married    Spouse name: Not on file   Number of children: 1   Years of education: Not on file   Highest education level: Associate degree: occupational, Scientist, product/process development, or vocational program  Occupational History    Employer: [employer]  Tobacco Use   Smoking status: Former    Current packs/day: 0.00    Average packs/day: 0.5 packs/day for 40.0 years (20.0 ttl pk-yrs)    Types: Cigarettes    Start date: 11/17/1981    Quit date:  11/17/2021    Years since quitting: 2.2   Smokeless tobacco: Never  Vaping Use   Vaping status: Never Used  Substance and Sexual Activity   Alcohol use: Not Currently   Drug use: Never   Sexual activity: Not Currently  Other Topics Concern   Not on file  Social History Narrative   Not on file   Social Drivers of Health   Financial Resource Strain: Low Risk  (01/19/2024)   Overall Financial Resource Strain (CARDIA)    Difficulty of Paying Living Expenses: Not very hard  Food Insecurity: No Food Insecurity (01/19/2024)   Hunger Vital Sign    Worried About Running Out of Food in the Last Year: Never true    Ran Out of Food in the Last Year: Never true  Transportation Needs: No Transportation Needs (01/19/2024)   PRAPARE - Administrator, Civil Service (Medical): No    Lack of Transportation (Non-Medical): No  Physical Activity: Insufficiently Active (01/19/2024)   Exercise Vital Sign    Days of Exercise per Week: 2 days    Minutes of Exercise per Session: 10 min  Stress: No Stress Concern Present (01/19/2024)   [employer] of Occupational Health - Occupational Stress Questionnaire    Feeling of Stress : Only a little  Social Connections: Unknown (01/19/2024)   Social Connection and Isolation Panel [NHANES]    Frequency of Communication with Friends and Family: Once a week    Frequency of Social Gatherings with Friends and Family: Once a week    Attends Religious Services: Patient declined    Database administrator or Organizations: No    Attends Banker Meetings: Never    Marital Status: Married  Catering manager Violence: Not At Risk (07/15/2023)   Humiliation, Afraid, Rape, and Kick questionnaire    Fear of Current or Ex-Partner: No    Emotionally Abused: No    Physically Abused: No    Sexually Abused: No   Current Outpatient Medications on File Prior to Visit  Medication Sig Dispense Refill   albuterol  (VENTOLIN  HFA) 108 (90 Base) MCG/ACT  inhaler Inhale 2 puffs into the lungs every 6 (six) hours as needed for wheezing or shortness of breath. 8 g 2   aspirin EC 81 MG tablet Take 81 mg by mouth daily. Swallow whole.     atorvastatin  (LIPITOR) 40 MG tablet TAKE 1 TABLET DAILY 90 tablet 0   Cholecalciferol 125 MCG (5000 UT) capsule Take 50,000 Units by mouth once a week.     Coenzyme Q10 (CO Q-10) 100 MG CAPS Take 100 mg by mouth daily.     estradiol  (ESTRACE ) 0.1 MG/GM vaginal cream Place 1 Applicatorful vaginally 2 (two) times a week. 42.5 g 5   loratadine (CLARITIN) 10 MG tablet Take 10 mg by mouth at bedtime.      Omega-3 Fatty Acids (FISH OIL) 1200 MG CAPS      pantoprazole  (PROTONIX ) 40 MG tablet TAKE 1 TABLET DAILY 90 tablet 0   Probiotic Product (PROBIOTIC PO) Take 1 capsule by mouth daily. Provitalize probiotic     rOPINIRole  (REQUIP ) 0.5 MG tablet Take 1 tablet (0.5 mg total) by mouth at bedtime. 30 tablet 2   sodium chloride  (OCEAN) 0.65 % SOLN nasal spray Place 1 spray into both nostrils as needed for congestion.     No current facility-administered medications on file prior to visit.   Allergies  Allergen Reactions   Penicillins Other (See Comments) and Hives    DID THE REACTION INVOLVE: Swelling of the face/tongue/throat, SOB, or low BP? Yes  Sudden or severe rash/hives, skin peeling, or the inside of the mouth or nose? Yes  Did it require medical treatment? Yes  When did it last happen? 15-20 YRS AGO     If all above answers are "NO", may proceed with cephalosporin use.  DID THE REACTION INVOLVE: Swelling of the face/tongue/throat, SOB, or low BP? Yes, Sudden or severe rash/hives, skin peeling, or the inside of the mouth or nose? Yes, Did it require medical treatment? Yes, When did it last happen? 15-20 YRS AGO   , If all above answers are "NO", may proceed with cephalosporin use.   Codeine Nausea Only   Family History  Problem Relation Age of Onset   Hypertension Mother    Hyperlipidemia Mother     Diabetes Mother    Arthritis Mother    Alzheimer's disease Mother    Osteoarthritis Mother    Hypertension Father    Hyperlipidemia Father    Diabetes Father    Coronary artery disease Father    Hypothyroidism Sister    Anxiety disorder Sister    Depression Sister    Breast cancer Paternal Aunt    Breast cancer Maternal Grandmother    Breast cancer Cousin    Hyperparathyroidism Neg Hx    Pancreatic cancer Neg Hx    Prostate cancer Neg Hx    Colon cancer Neg Hx    Ovarian cancer Neg Hx    Endometrial cancer Neg Hx    PE: BP 120/80   Pulse 73   Ht 5' 2.5" (1.588 m)   Wt 211 lb 9.6 oz (96 kg)   SpO2 97%   BMI 38.09 kg/m  Wt Readings from Last 3 Encounters:  02/02/24 211 lb 9.6 oz (96 kg)  01/21/24 214 lb (97.1 kg)  01/20/24 213 lb 4.8 oz (96.8 kg)   Constitutional: overweight, in NAD Eyes:  EOMI, no exophthalmos ENT: no neck masses, no cervical lymphadenopathy Cardiovascular: RRR, No MRG Respiratory: CTA B Musculoskeletal: no deformities Skin:no rashes Neurological: no tremor with outstretched hands  Assessment: 1. Primary hyperparathyroidism  2.  Vitamin D  insufficiency  3.  Increased alkaline phosphatase  4.  Thyroid  nodule  5.  Osteopenia  Plan: Patient with history of hypercalcemia, diagnosed as primary hyperparathyroidism by Dr. Washington Hacker.  Her calcium  was chronically elevated, PTH related protein was low and vitamin a level was normal.  Urine calcium  was also normal.  A nuclear medicine technetium sestamibi scan was positive for a right inferior parathyroid  adenoma.  She had minimally invasive parathyroidectomy with Dr. Sofia Dunn 06/20/2019.  PTH and calcium  normalized afterwards. - She did not necessarily feel different after surgery - Latest calcium  was normal on 01/21/2024: 9.8. - Will continue to follow her expectantly  2.  Vitamin D  insufficiency - She had slightly low vitamin D  levels in the past - Continues on ergocalciferol  50,000 units weekly -  Latest vitamin D  level was normal, at 80.5 on 01/21/2024.  We will not repeat this today, but I recommended to reduce the ergocalciferol  dose to every 2 weeks.  She usually has this checked by PCP every 6 months.  3.  Increased alkaline phosphatase - With resolution of her hyperparathyroidism, her alkaline phosphatase normalized, 113 at last check on 01/21/2024 - No further investigation is needed for this  4.  Thyroid  nodule - I reviewed the results of the latest thyroid  ultrasound from 05/2022-she has a very small, 4 mm, left thyroid  nodule - We discussed that these very small nodules are very common and  no investigation or intervention is needed for this - She has no neck compression symptoms or masses felt on palpation of her neck today - Her latest TSH was normal: Lab Results  Component Value Date   TSH 1.700 01/21/2024   5.  Osteopenia -Per the review of her bone density scan from 2022, she had low T-scores, however, at that time, she also had hyperparathyroidism and was also on anastrozole .  I explained that aromatase inhibitors could decrease bone density.  She was able to stop anastrozole  afterwards.  Both stopping anastrozole  and resolving hyperparathyroidism could be beneficial for bone density -She did have another bone density scan 04/14/2023 and the scores appeared to be better. - Discussed about trying to maintain normal calcium  and vitamin D  levels and also trying to aim for weightbearing exercises (recommended either elastic bands or dumbbells) at least 5 out of 7 days, but no other intervention is needed for now  Emilie Harden, MD PhD [facility] Endocrinology

## 2024-02-09 ENCOUNTER — Ambulatory Visit (INDEPENDENT_AMBULATORY_CARE_PROVIDER_SITE_OTHER)
Admission: RE | Admit: 2024-02-09 | Discharge: 2024-02-09 | Disposition: A | Discharge status: Home / Self Care | Source: Ambulatory Visit | Attending: Physician Assistant | Admitting: Physician Assistant

## 2024-02-09 DIAGNOSIS — Z720 Tobacco use: Secondary | ICD-10-CM

## 2024-02-09 DIAGNOSIS — Z122 Encounter for screening for malignant neoplasm of respiratory organs: Secondary | ICD-10-CM

## 2024-02-09 DIAGNOSIS — F17201 Nicotine dependence, unspecified, in remission: Secondary | ICD-10-CM

## 2024-02-21 ENCOUNTER — Encounter: Payer: Self-pay | Admitting: Physician Assistant

## 2024-03-05 ENCOUNTER — Ambulatory Visit: Payer: Self-pay | Admitting: Family Medicine

## 2024-03-14 ENCOUNTER — Other Ambulatory Visit: Payer: Self-pay | Admitting: Physician Assistant

## 2024-03-14 DIAGNOSIS — J438 Other emphysema: Secondary | ICD-10-CM

## 2024-03-14 MED ORDER — FLUTICASONE-SALMETEROL 100-50 MCG/ACT IN AEPB: 1.0000 | INHALATION_SPRAY | Freq: Two times a day (BID) | RESPIRATORY_TRACT | 3 refills | Status: AC

## 2024-03-24 ENCOUNTER — Other Ambulatory Visit: Payer: Self-pay | Admitting: Physician Assistant

## 2024-03-24 DIAGNOSIS — J438 Other emphysema: Secondary | ICD-10-CM

## 2024-03-29 ENCOUNTER — Other Ambulatory Visit: Payer: Self-pay | Admitting: Physician Assistant

## 2024-03-29 DIAGNOSIS — G2581 Restless legs syndrome: Secondary | ICD-10-CM

## 2024-04-14 ENCOUNTER — Other Ambulatory Visit: Payer: Self-pay | Admitting: Physician Assistant

## 2024-04-14 DIAGNOSIS — E782 Mixed hyperlipidemia: Secondary | ICD-10-CM

## 2024-04-14 DIAGNOSIS — K219 Gastro-esophageal reflux disease without esophagitis: Secondary | ICD-10-CM

## 2024-06-16 ENCOUNTER — Encounter (HOSPITAL_BASED_OUTPATIENT_CLINIC_OR_DEPARTMENT_OTHER): Payer: Self-pay

## 2024-06-16 ENCOUNTER — Other Ambulatory Visit (HOSPITAL_BASED_OUTPATIENT_CLINIC_OR_DEPARTMENT_OTHER): Payer: Self-pay

## 2024-06-16 ENCOUNTER — Ambulatory Visit (HOSPITAL_BASED_OUTPATIENT_CLINIC_OR_DEPARTMENT_OTHER)
Admission: EM | Admit: 2024-06-16 | Discharge: 2024-06-16 | Disposition: A | Discharge status: Home / Self Care | Source: Ambulatory Visit | Attending: Family Medicine | Admitting: Family Medicine

## 2024-06-16 DIAGNOSIS — J0100 Acute maxillary sinusitis, unspecified: Secondary | ICD-10-CM | POA: Diagnosis not present

## 2024-06-16 LAB — POC SOFIA SARS ANTIGEN FIA: SARS Coronavirus 2 Ag: NEGATIVE

## 2024-06-16 MED ORDER — FLUCONAZOLE 150 MG PO TABS
150.0000 mg | ORAL_TABLET | Freq: Every day | ORAL | 0 refills | Status: AC
  Filled 2024-06-16: qty 2, 2d supply, fill #0

## 2024-06-16 MED ORDER — DOXYCYCLINE HYCLATE 100 MG PO CAPS
100.0000 mg | ORAL_CAPSULE | Freq: Two times a day (BID) | ORAL | 0 refills | Status: AC
Start: 2024-06-16 — End: 2024-06-23
  Filled 2024-06-16: qty 14, 7d supply, fill #0

## 2024-06-16 MED ORDER — TRIAMCINOLONE ACETONIDE 40 MG/ML IJ SUSP
40.0000 mg | Freq: Once | INTRAMUSCULAR | Status: AC
Start: 2024-06-16 — End: 2024-06-16
  Administered 2024-06-16: 40 mg via INTRAMUSCULAR

## 2024-06-16 NOTE — ED Provider Notes (Signed)
 [facility]    CSN: [id number] Arrival date & time: 06/16/24  1139      History   Chief Complaint Chief Complaint  Patient presents with   Headache   Sneezing   Head congestion    HPI Victoria Parsons Victoria Parsons is a 63 y.o. female.   Reports that she hasn't been feeling well for last 2 days. Woke up with headache, head congestion, and sore throat today. Denies known fever.    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    Hypercalcemia    Hypercholesterolemia    Osteopenia 04/19/2019   Sixth (abducent) nerve palsy, right eye 2019   from TIA in 2019 is now resolved   Stroke (HCC) 2019   hx TIA   Tobacco abuse    quit 11/17/21 and takes wellbutrin  while stopping smoking    Patient Active Problem List   Diagnosis Date Noted   Primary hyperparathyroidism (HCC) 07/15/2023   Gastroesophageal reflux disease without esophagitis 07/15/2023   Aortic atherosclerosis (HCC) 07/15/2023   Thyroid  nodule 07/31/2022   Vitamin D  insufficiency 07/31/2022   Hyperparathyroidism, primary (HCC) 01/30/2022   Atrophic vaginitis 01/08/2022    Class: Chronic   Cervical dysplasia    Papanicolaou smear of cervix with high grade squamous intraepithelial lesion (HGSIL) 11/03/2021   Tobacco abuse 11/03/2021   History of cervical dysplasia 11/03/2021   Hypercalcemia 05/01/2021   Alkaline phosphatase elevation 05/01/2021   Osteoporosis screening 12/24/2020   Acute right ankle pain 12/24/2020   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   Routine physical examination 06/18/2020   Hip pain, acute, right 03/27/2020   Infiltrating ductal carcinoma of left breast (HCC) 12/17/2019   Mixed hyperlipidemia 12/15/2019   Need for vaccination 12/15/2019   Encounter for  smoking cessation counseling 12/15/2019   Osteopenia 04/19/2019    Class: Chronic    Past Surgical History:  Procedure Laterality Date   BREAST LUMPECTOMY Left 2014   benign   BREAST LUMPECTOMY Left 2018   breast ca   BREAST SURGERY Left 2018   CA   CERVICAL CONE BIOPSY N/A 11/2017   CERVICAL CONE BIOPSY N/A 11/2019   PARATHYROIDECTOMY Right 06/19/2022   Procedure: RIGHT INFERIOR PARATHYROIDECTOMY;  Surgeon: Eletha Boas, MD;  Location: [facility];  Service: General;  Laterality: Right;   pilonidal cyst N/A    procedure in high school   ROBOTIC ASSISTED TOTAL HYSTERECTOMY WITH BILATERAL SALPINGO OOPHERECTOMY Bilateral 12/09/2021   Procedure: XI ROBOTIC ASSISTED TOTAL HYSTERECTOMY WITH BILATERAL SALPINGO OOPHORECTOMY;  Surgeon: Viktoria Comer SAUNDERS, MD;  Location: [facility];  Service: Gynecology;  Laterality: Bilateral;   TONSILLECTOMY Right    as a child   WISDOM TOOTH EXTRACTION      OB History     Gravida  1   Para  0   Term      Preterm      AB      Living         SAB      IAB      Ectopic      Multiple      Live Births               Home  Medications    Prior to Admission medications   Medication Sig Start Date End Date Taking? Authorizing Provider  doxycycline  (VIBRAMYCIN ) 100 MG capsule Take 1 capsule (100 mg total) by mouth 2 (two) times daily for 7 days. 06/16/24 06/23/24 Yes Gael Londo A, FNP  fluconazole  (DIFLUCAN ) 150 MG tablet Take 1 tablet (150 mg total) by mouth daily for 2 days. Take one tab today and one in 3 days is symptoms remain 06/16/24 06/18/24 Yes Daphene Chisholm A, FNP  albuterol  (VENTOLIN  HFA) 108 (90 Base) MCG/ACT inhaler INHALE TWO PUFFS BY MOUTH EVERY 6 HOURS AS NEEDED FOR WHEEZING OR SHORTNESS OF BREATH 03/24/24   Nicholaus Credit, PA-C  aspirin EC 81 MG tablet Take 81 mg by mouth daily. Swallow whole.    [provider]  atorvastatin  (LIPITOR) 40 MG tablet TAKE 1 TABLET DAILY 04/14/24   Nicholaus Credit, PA-C  Cholecalciferol 125 MCG (5000  UT) capsule Take 50,000 Units by mouth once a week.    [provider]  Coenzyme Q10 (CO Q-10) 100 MG CAPS Take 100 mg by mouth daily.    [provider]  estradiol  (ESTRACE ) 0.1 MG/GM vaginal cream Place 1 Applicatorful vaginally 2 (two) times a week. 01/20/24   Cornelius Wanda DEL, MD  fluticasone -salmeterol (WIXELA INHUB) 100-50 MCG/ACT AEPB Inhale 1 puff into the lungs 2 (two) times daily. 03/14/24   Nicholaus Credit, PA-C  loratadine (CLARITIN) 10 MG tablet Take 10 mg by mouth at bedtime.     [provider]  Omega-3 Fatty Acids (FISH OIL) 1200 MG CAPS  09/04/22   [provider]  pantoprazole  (PROTONIX ) 40 MG tablet TAKE 1 TABLET DAILY 04/14/24   Nicholaus Credit, PA-C  Probiotic Product (PROBIOTIC PO) Take 1 capsule by mouth daily. Provitalize probiotic    [provider]  rOPINIRole  (REQUIP ) 0.5 MG tablet TAKE ONE TABLET BY MOUTH ONCE DAILY AT BEDTIME 03/30/24   Nicholaus Credit, PA-C  sodium chloride  (OCEAN) 0.65 % SOLN nasal spray Place 1 spray into both nostrils as needed for congestion.    [provider]    Family History Family History  Problem Relation Age of Onset   Hypertension Mother    Hyperlipidemia Mother    Diabetes Mother    Arthritis Mother    Alzheimer's disease Mother    Osteoarthritis Mother    Hypertension Father    Hyperlipidemia Father    Diabetes Father    Coronary artery disease Father    Hypothyroidism Sister    Anxiety disorder Sister    Depression Sister    Breast cancer Paternal Aunt    Breast cancer Maternal Grandmother    Breast cancer Cousin    Hyperparathyroidism Neg Hx    Pancreatic cancer Neg Hx    Prostate cancer Neg Hx    Colon cancer Neg Hx    Ovarian cancer Neg Hx    Endometrial cancer Neg Hx     Social History Social History   Tobacco Use   Smoking status: Former    Current packs/day: 0.00    Average packs/day: 0.5 packs/day for 40.0 years (20.0 ttl pk-yrs)    Types: Cigarettes    Start  date: 11/17/1981    Quit date: 11/17/2021    Years since quitting: 2.5   Smokeless tobacco: Never  Vaping Use   Vaping status: Never Used  Substance Use Topics   Alcohol use: Not Currently   Drug use: Never     Allergies   Penicillins and Codeine  Review of Systems Review of Systems  Neurological:  Positive for headaches.     Physical Exam Triage Vital Signs ED Triage Vitals  Encounter Vitals Group     BP 06/16/24 1214 (!) 144/91     Girls Systolic BP Percentile --      Girls Diastolic BP Percentile --      Boys Systolic BP Percentile --      Boys Diastolic BP Percentile --      Pulse Rate 06/16/24 1214 87     Resp 06/16/24 1214 20     Temp 06/16/24 1214 98.5 F (36.9 C)     Temp Source 06/16/24 1214 Oral     SpO2 06/16/24 1214 97 %     Weight --      Height --      Head Circumference --      Peak Flow --      Pain Score 06/16/24 1216 7     Pain Loc --      Pain Education --      Exclude from Growth Chart --    No data found.  Updated Vital Signs BP (!) 144/91 (BP Location: Left Arm)   Pulse 87   Temp 98.5 F (36.9 C) (Oral)   Resp 20   SpO2 97%   Visual Acuity Right Eye Distance:   Left Eye Distance:   Bilateral Distance:    Right Eye Near:   Left Eye Near:    Bilateral Near:     Physical Exam   UC Treatments / Results  Labs (all labs ordered are listed, but only abnormal results are displayed) Labs Reviewed  POC SOFIA SARS ANTIGEN FIA - Normal    EKG   Radiology No results found.  Procedures Procedures (including critical care time)  Medications Ordered in UC Medications  triamcinolone  acetonide (KENALOG -40) injection 40 mg (has no administration in time range)    Initial Impression / Assessment and Plan / UC Course  I have reviewed the triage vital signs and the nursing notes.  Pertinent labs & imaging results that were available during my care of the patient were reviewed by me and considered in my medical decision making  (see chart for details).     *** Final Clinical Impressions(s) / UC Diagnoses   Final diagnoses:  Acute non-recurrent maxillary sinusitis     Discharge Instructions      Recommend allergy medication over-the-counter to include Zyrtec, Claritin and Flonase nasal spray. Antibiotics as prescribed Steroid injection given here today to help with sinus inflammation.  Follow-up as needed   ED Prescriptions     Medication Sig Dispense Auth. Provider   doxycycline  (VIBRAMYCIN ) 100 MG capsule Take 1 capsule (100 mg total) by mouth 2 (two) times daily for 7 days. 14 capsule Paige Vanderwoude A, FNP   fluconazole  (DIFLUCAN ) 150 MG tablet Take 1 tablet (150 mg total) by mouth daily for 2 days. Take one tab today and one in 3 days is symptoms remain 2 tablet Adah Corning A, FNP      PDMP not reviewed this encounter.

## 2024-06-16 NOTE — ED Triage Notes (Signed)
 Reports that she hasn't been feeling well for last 2 days. Woke up with headache, head congestion, and sore throat today. Denies known fever.

## 2024-06-16 NOTE — Discharge Instructions (Addendum)
 Recommend allergy medication over-the-counter to include Zyrtec, Claritin and Flonase nasal spray. Antibiotics as prescribed Steroid injection given here today to help with sinus inflammation.  Follow-up as needed

## 2024-07-03 ENCOUNTER — Other Ambulatory Visit: Payer: Self-pay | Admitting: Physician Assistant

## 2024-07-03 DIAGNOSIS — G2581 Restless legs syndrome: Secondary | ICD-10-CM

## 2024-07-07 ENCOUNTER — Other Ambulatory Visit: Payer: Self-pay | Admitting: Physician Assistant

## 2024-07-07 DIAGNOSIS — K219 Gastro-esophageal reflux disease without esophagitis: Secondary | ICD-10-CM

## 2024-07-07 DIAGNOSIS — E782 Mixed hyperlipidemia: Secondary | ICD-10-CM

## 2024-07-26 ENCOUNTER — Ambulatory Visit: Admitting: Physician Assistant

## 2024-07-26 ENCOUNTER — Encounter: Payer: Self-pay | Admitting: Physician Assistant

## 2024-07-26 VITALS — BP 130/82 | HR 67 | Temp 97.8°F | Resp 18 | Ht 62.5 in | Wt 209.8 lb

## 2024-07-26 DIAGNOSIS — J438 Other emphysema: Secondary | ICD-10-CM

## 2024-07-26 DIAGNOSIS — E559 Vitamin D deficiency, unspecified: Secondary | ICD-10-CM | POA: Diagnosis not present

## 2024-07-26 DIAGNOSIS — Z23 Encounter for immunization: Secondary | ICD-10-CM

## 2024-07-26 DIAGNOSIS — G2581 Restless legs syndrome: Secondary | ICD-10-CM

## 2024-07-26 DIAGNOSIS — K219 Gastro-esophageal reflux disease without esophagitis: Secondary | ICD-10-CM

## 2024-07-26 DIAGNOSIS — E782 Mixed hyperlipidemia: Secondary | ICD-10-CM

## 2024-07-26 DIAGNOSIS — Z853 Personal history of malignant neoplasm of breast: Secondary | ICD-10-CM

## 2024-07-26 DIAGNOSIS — E210 Primary hyperparathyroidism: Secondary | ICD-10-CM

## 2024-07-26 LAB — POCT LIPID PANEL
HDL: 47
LDL: 107
Non-HDL: 126
TC/HDL: 2.3
TC: 173
TRG: 93

## 2024-07-26 NOTE — Progress Notes (Signed)
 Acute Office Visit  Subjective:    Patient ID: Victoria Parsons, female    DOB: October 10, 1960, 63 y.o.   MRN: [id number]  Chief Complaint  Patient presents with   Medical Management of Chronic Issues    Hyperlipidemia   Patient is in today for follow up hyperlipidemia Pt presents with hyperlipidemia.Compliance with treatment has been goodThe patient is compliant with medications, maintains a low cholesterol diet , follows up as directed , and maintains an exercise regimen . The patient denies experiencing any hypercholesterolemia related symptoms. Pt currently on lipitor 40 mg qd, fish oil and co Q10 -poc lipid done in office  Pt with history of breast cancer - she currently follows with oncology every year - Dr Cornelius - symptoms have been stable -is up to date on mammogram  Pt with hypercalcemia /hyperparathyroidism - has seen endocrinologist and has been advised not to take any calcium  supplements   Pt with GERD - symptoms stable on protonix  40mg  qd  Pt was noted to have emphysema on lung CT - she is currently using albuterol  and wixela inhalers and states breathing is stable  Pt with restless leg syndrome - doing well on requip   Pt would like flu shot today  Past Medical History:  Diagnosis Date   Breast cancer    on Anastrazole - 5 years will be fall 2023   Breast cancer (HCC)    Complication of anesthesia    one breast surgery, she woke up with itching all over   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    Hypercalcemia    Hypercholesterolemia    Osteopenia 04/19/2019   Sixth (abducent) nerve palsy, right eye 2019   from TIA in 2019 is now resolved   Stroke (HCC) 2019   hx TIA   Tobacco abuse    quit 11/17/21 and takes wellbutrin  while stopping smoking    Past Surgical History:  Procedure Laterality Date   BREAST LUMPECTOMY Left 2014   benign   BREAST LUMPECTOMY Left 2018   breast ca   BREAST SURGERY Left 2018   CA   CERVICAL CONE BIOPSY N/A  11/2017   CERVICAL CONE BIOPSY N/A 11/2019   PARATHYROIDECTOMY Right 06/19/2022   Procedure: RIGHT INFERIOR PARATHYROIDECTOMY;  Surgeon: Eletha Boas, MD;  Location: [facility];  Service: General;  Laterality: Right;   pilonidal cyst N/A    procedure in high school   ROBOTIC ASSISTED TOTAL HYSTERECTOMY WITH BILATERAL SALPINGO OOPHERECTOMY Bilateral 12/09/2021   Procedure: XI ROBOTIC ASSISTED TOTAL HYSTERECTOMY WITH BILATERAL SALPINGO OOPHORECTOMY;  Surgeon: Viktoria Comer SAUNDERS, MD;  Location: [facility];  Service: Gynecology;  Laterality: Bilateral;   TONSILLECTOMY Right    as a child   WISDOM TOOTH EXTRACTION      Family History  Problem Relation Age of Onset   Hypertension Mother    Hyperlipidemia Mother    Diabetes Mother    Arthritis Mother    Alzheimer's disease Mother    Osteoarthritis Mother    Hypertension Father    Hyperlipidemia Father    Diabetes Father    Coronary artery disease Father    Hypothyroidism Sister    Anxiety disorder Sister    Depression Sister    Breast cancer Paternal Aunt    Breast cancer Maternal Grandmother    Breast cancer Cousin    Hyperparathyroidism Neg Hx    Pancreatic cancer Neg Hx    Prostate cancer Neg Hx    Colon cancer Neg Hx  Ovarian cancer Neg Hx    Endometrial cancer Neg Hx     Social History   Socioeconomic History   Marital status: Married    Spouse name: Not on file   Number of children: 1   Years of education: Not on file   Highest education level: Associate degree: occupational, Scientist, product/process development, or vocational program  Occupational History    Employer: [employer]  Tobacco Use   Smoking status: Former    Current packs/day: 0.00    Average packs/day: 0.5 packs/day for 40.0 years (20.0 ttl pk-yrs)    Types: Cigarettes    Start date: 11/17/1981    Quit date: 11/17/2021    Years since quitting: 2.6   Smokeless tobacco: Never  Vaping Use   Vaping status: Never Used  Substance and Sexual Activity   Alcohol use: Not Currently    Drug use: Never   Sexual activity: Not Currently  Other Topics Concern   Not on file  Social History Narrative   Not on file   Social Drivers of Health   Financial Resource Strain: Patient Declined (07/23/2024)   Overall Financial Resource Strain (CARDIA)    Difficulty of Paying Living Expenses: Patient declined  Food Insecurity: Patient Declined (07/23/2024)   Hunger Vital Sign    Worried About Running Out of Food in the Last Year: Patient declined    Ran Out of Food in the Last Year: Patient declined  Transportation Needs: Patient Declined (07/23/2024)   PRAPARE - Administrator, Civil Service (Medical): Patient declined    Lack of Transportation (Non-Medical): Patient declined  Physical Activity: Unknown (07/23/2024)   Exercise Vital Sign    Days of Exercise per Week: Patient declined    Minutes of Exercise per Session: Not on file  Stress: Patient Declined (07/23/2024)   [employer] of Occupational Health - Occupational Stress Questionnaire    Feeling of Stress: Patient declined  Social Connections: Unknown (07/23/2024)   Social Connection and Isolation Panel    Frequency of Communication with Friends and Family: Patient declined    Frequency of Social Gatherings with Friends and Family: Patient declined    Attends Religious Services: Patient declined    Database administrator or Organizations: Patient declined    Attends Banker Meetings: Not on file    Marital Status: Patient declined  Intimate Partner Violence: Not At Risk (07/15/2023)   Humiliation, Afraid, Rape, and Kick questionnaire    Fear of Current or Ex-Partner: No    Emotionally Abused: No    Physically Abused: No    Sexually Abused: No     Current Outpatient Medications:    albuterol  (VENTOLIN  HFA) 108 (90 Base) MCG/ACT inhaler, INHALE TWO PUFFS BY MOUTH EVERY 6 HOURS AS NEEDED FOR WHEEZING OR SHORTNESS OF BREATH, Disp: 8.5 g, Rfl: 2   aspirin EC 81 MG tablet, Take 81 mg by  mouth daily. Swallow whole., Disp: , Rfl:    atorvastatin  (LIPITOR) 40 MG tablet, TAKE 1 TABLET DAILY, Disp: 90 tablet, Rfl: 1   Cholecalciferol 125 MCG (5000 UT) capsule, Take 50,000 Units by mouth once a week., Disp: , Rfl:    Coenzyme Q10 (CO Q-10) 100 MG CAPS, Take 100 mg by mouth daily., Disp: , Rfl:    estradiol  (ESTRACE ) 0.1 MG/GM vaginal cream, Place 1 Applicatorful vaginally 2 (two) times a week., Disp: 42.5 g, Rfl: 5   fluticasone -salmeterol (WIXELA INHUB) 100-50 MCG/ACT AEPB, Inhale 1 puff into the lungs 2 (two) times daily., Disp:  3 each, Rfl: 3   loratadine (CLARITIN) 10 MG tablet, Take 10 mg by mouth at bedtime. , Disp: , Rfl:    Omega-3 Fatty Acids (FISH OIL) 1200 MG CAPS, , Disp: , Rfl:    pantoprazole  (PROTONIX ) 40 MG tablet, TAKE 1 TABLET DAILY, Disp: 90 tablet, Rfl: 1   Probiotic Product (PROBIOTIC PO), Take 1 capsule by mouth daily. Provitalize probiotic, Disp: , Rfl:    rOPINIRole  (REQUIP ) 0.5 MG tablet, TAKE ONE TABLET BY MOUTH ONCE DAILY AT BEDTIME, Disp: 30 tablet, Rfl: 2   sodium chloride  (OCEAN) 0.65 % SOLN nasal spray, Place 1 spray into both nostrils as needed for congestion., Disp: , Rfl:    Allergies  Allergen Reactions   Penicillins Other (See Comments) and Hives    DID THE REACTION INVOLVE: Swelling of the face/tongue/throat, SOB, or low BP? Yes  Sudden or severe rash/hives, skin peeling, or the inside of the mouth or nose? Yes  Did it require medical treatment? Yes  When did it last happen? 15-20 YRS AGO     If all above answers are NO, may proceed with cephalosporin use.  DID THE REACTION INVOLVE: Swelling of the face/tongue/throat, SOB, or low BP? Yes, Sudden or severe rash/hives, skin peeling, or the inside of the mouth or nose? Yes, Did it require medical treatment? Yes, When did it last happen? 15-20 YRS AGO   , If all above answers are NO, may proceed with cephalosporin use.   Codeine Nausea Only  CONSTITUTIONAL: Negative for chills, fatigue,  fever, unintentional weight gain and unintentional weight loss.  E/N/T: Negative for ear pain, nasal congestion and sore throat.  CARDIOVASCULAR: Negative for chest pain, dizziness, palpitations and pedal edema.  RESPIRATORY: Negative for recent cough and dyspnea.  GASTROINTESTINAL: Negative for abdominal pain, acid reflux symptoms, constipation, diarrhea, nausea and vomiting.  MSK: Negative for arthralgias and myalgias.  INTEGUMENTARY: Negative for rash.  NEUROLOGICAL: Negative for dizziness and headaches.  PSYCHIATRIC: Negative for sleep disturbance and to question depression screen.  Negative for depression, negative for anhedonia.       Objective:   PHYSICAL EXAM:   VS: BP 130/82   Pulse 67   Temp 97.8 F (36.6 C) (Temporal)   Resp 18   Ht 5' 2.5 (1.588 m)   Wt 209 lb 12.8 oz (95.2 kg)   SpO2 98%   BMI 37.76 kg/m   GEN: Well nourished, well developed, in no acute distress   Cardiac: RRR; no murmurs, rubs, or gallops,no edema - Respiratory:  normal respiratory rate and pattern with no distress - normal breath sounds with no rales, rhonchi, wheezes or rubs  MS: no deformity or atrophy  Skin: warm and dry, no rash  Neuro:  Alert and Oriented x 3,  - CN II-Xii grossly intact Psych: euthymic mood, appropriate affect and demeanor  Office Visit on 07/26/2024  Component Date Value Ref Range Status   TC 07/26/2024 173   Final   HDL 07/26/2024 47   Final   TRG 07/26/2024 93   Final   LDL 07/26/2024 107   Final   Non-HDL 07/26/2024 126   Final   TC/HDL 07/26/2024 2.3   Final        Assessment & Plan:   Problem List Items Addressed This Visit       Other   Mixed hyperlipidemia - Primary   Relevant Orders   CBC with Differential/Platelet   Comprehensive metabolic panel   Lipid panel Continue meds as directed and  watch diet - decrease fried/fatty foods       History of breast cancer Continue with yearly mammograms      Hypercalcemia/Primary  hyperparathyroidism Continue follow up with endocrinologist  GERD  Continue protonix     Vit D def Labwork pending  Other emphysema (HCC) Continue current inhalers as directed     Restless leg syndrome Continue requip   Need flu shot Flublok given        No orders of the defined types were placed in this encounter.    SARA R Wynne Jury, PA-C

## 2024-07-27 ENCOUNTER — Ambulatory Visit: Payer: Self-pay | Admitting: Physician Assistant

## 2024-07-27 LAB — CBC WITH DIFFERENTIAL/PLATELET
Basophils Absolute: 0.1 x10E3/uL (ref 0.0–0.2)
Basos: 1 %
EOS (ABSOLUTE): 0.1 x10E3/uL (ref 0.0–0.4)
Eos: 1 %
Hematocrit: 45.4 % (ref 34.0–46.6)
Hemoglobin: 14.7 g/dL (ref 11.1–15.9)
Immature Grans (Abs): 0 x10E3/uL (ref 0.0–0.1)
Immature Granulocytes: 0 %
Lymphocytes Absolute: 1.9 x10E3/uL (ref 0.7–3.1)
Lymphs: 26 %
MCH: 31.6 pg (ref 26.6–33.0)
MCHC: 32.4 g/dL (ref 31.5–35.7)
MCV: 98 fL — ABNORMAL HIGH (ref 79–97)
Monocytes Absolute: 0.5 x10E3/uL (ref 0.1–0.9)
Monocytes: 8 %
Neutrophils Absolute: 4.5 x10E3/uL (ref 1.4–7.0)
Neutrophils: 64 %
Platelets: 284 x10E3/uL (ref 150–450)
RBC: 4.65 x10E6/uL (ref 3.77–5.28)
RDW: 12.8 % (ref 11.7–15.4)
WBC: 7.1 x10E3/uL (ref 3.4–10.8)

## 2024-07-27 LAB — COMPREHENSIVE METABOLIC PANEL WITH GFR
ALT: 23 IU/L (ref 0–32)
AST: 23 IU/L (ref 0–40)
Albumin: 4.3 g/dL (ref 3.9–4.9)
Alkaline Phosphatase: 110 IU/L (ref 49–135)
BUN/Creatinine Ratio: 24 (ref 12–28)
BUN: 23 mg/dL (ref 8–27)
Bilirubin Total: 0.4 mg/dL (ref 0.0–1.2)
CO2: 22 mmol/L (ref 20–29)
Calcium: 9.7 mg/dL (ref 8.7–10.3)
Chloride: 107 mmol/L — ABNORMAL HIGH (ref 96–106)
Creatinine, Ser: 0.97 mg/dL (ref 0.57–1.00)
Globulin, Total: 2.2 g/dL (ref 1.5–4.5)
Glucose: 96 mg/dL (ref 70–99)
Potassium: 4.8 mmol/L (ref 3.5–5.2)
Sodium: 143 mmol/L (ref 134–144)
Total Protein: 6.5 g/dL (ref 6.0–8.5)
eGFR: 66 mL/min/1.73 (ref >59)

## 2024-07-27 LAB — VITAMIN D 25 HYDROXY (VIT D DEFICIENCY, FRACTURES): Vit D, 25-Hydroxy: 67.5 ng/mL (ref 30.0–100.0)

## 2024-10-03 ENCOUNTER — Other Ambulatory Visit: Payer: Self-pay | Admitting: Physician Assistant

## 2024-10-03 DIAGNOSIS — G2581 Restless legs syndrome: Secondary | ICD-10-CM

## 2024-10-06 ENCOUNTER — Encounter: Payer: Self-pay | Admitting: Physician Assistant

## 2024-10-09 ENCOUNTER — Other Ambulatory Visit: Payer: Self-pay

## 2024-10-09 DIAGNOSIS — G2581 Restless legs syndrome: Secondary | ICD-10-CM

## 2024-10-09 MED ORDER — ROPINIROLE HCL 0.5 MG PO TABS: 0.5000 mg | ORAL_TABLET | Freq: Every day | ORAL | 1 refills | Status: AC

## 2025-01-15 ENCOUNTER — Ambulatory Visit (HOSPITAL_BASED_OUTPATIENT_CLINIC_OR_DEPARTMENT_OTHER): Admitting: Radiology

## 2025-01-19 ENCOUNTER — Encounter: Admitting: Hematology and Oncology

## 2025-01-24 ENCOUNTER — Ambulatory Visit: Admitting: Physician Assistant
